# Patient Record
Sex: Female | Born: 1980 | Race: White | Hispanic: No | Marital: Married | Smoking: Never smoker
Health system: Southern US, Community
[De-identification: ages and names within clinical notes are randomized; demographics above are authoritative.]

## PROBLEM LIST (undated history)

## (undated) DIAGNOSIS — F329 Major depressive disorder, single episode, unspecified: Secondary | ICD-10-CM

## (undated) DIAGNOSIS — F419 Anxiety disorder, unspecified: Secondary | ICD-10-CM

## (undated) DIAGNOSIS — F513 Sleepwalking [somnambulism]: Secondary | ICD-10-CM

## (undated) DIAGNOSIS — M5126 Other intervertebral disc displacement, lumbar region: Secondary | ICD-10-CM

## (undated) DIAGNOSIS — F32A Depression, unspecified: Secondary | ICD-10-CM

## (undated) DIAGNOSIS — L409 Psoriasis, unspecified: Secondary | ICD-10-CM

## (undated) DIAGNOSIS — R87629 Unspecified abnormal cytological findings in specimens from vagina: Secondary | ICD-10-CM

## (undated) DIAGNOSIS — M5124 Other intervertebral disc displacement, thoracic region: Secondary | ICD-10-CM

## (undated) HISTORY — DX: Sleepwalking (somnambulism): F51.3

## (undated) HISTORY — DX: Anxiety disorder, unspecified: F41.9

## (undated) HISTORY — DX: Psoriasis, unspecified: L40.9

## (undated) HISTORY — PX: COLPOSCOPY: SHX161

## (undated) HISTORY — DX: Unspecified abnormal cytological findings in specimens from vagina: R87.629

## (undated) HISTORY — DX: Other intervertebral disc displacement, thoracic region: M51.24

## (undated) HISTORY — DX: Major depressive disorder, single episode, unspecified: F32.9

## (undated) HISTORY — DX: Depression, unspecified: F32.A

## (undated) HISTORY — PX: COCCYGECTOMY: SUR274

## (undated) HISTORY — DX: Other intervertebral disc displacement, lumbar region: M51.26

---

## 2007-05-03 ENCOUNTER — Inpatient Hospital Stay (HOSPITAL_COMMUNITY): Admission: RE | Admit: 2007-05-03 | Discharge: 2007-05-05 | Payer: Self-pay | Admitting: Gynecology

## 2007-05-03 ENCOUNTER — Encounter (INDEPENDENT_AMBULATORY_CARE_PROVIDER_SITE_OTHER): Payer: Self-pay | Admitting: Obstetrics & Gynecology

## 2007-05-15 ENCOUNTER — Ambulatory Visit: Payer: Self-pay

## 2010-02-13 ENCOUNTER — Ambulatory Visit: Payer: Self-pay | Admitting: Gynecologic Oncology

## 2010-03-05 ENCOUNTER — Ambulatory Visit: Payer: Self-pay | Admitting: Gynecologic Oncology

## 2010-06-15 ENCOUNTER — Ambulatory Visit: Payer: Self-pay | Admitting: Gynecologic Oncology

## 2010-06-25 ENCOUNTER — Ambulatory Visit: Payer: Self-pay | Admitting: Gynecologic Oncology

## 2010-06-27 LAB — PATHOLOGY REPORT

## 2010-07-16 ENCOUNTER — Ambulatory Visit: Payer: Self-pay | Admitting: Gynecologic Oncology

## 2010-08-15 ENCOUNTER — Ambulatory Visit: Payer: Self-pay | Admitting: Gynecologic Oncology

## 2010-09-17 ENCOUNTER — Ambulatory Visit: Payer: Self-pay | Admitting: Gynecologic Oncology

## 2010-10-01 ENCOUNTER — Ambulatory Visit: Payer: Self-pay | Admitting: Gynecologic Oncology

## 2010-10-16 ENCOUNTER — Ambulatory Visit: Payer: Self-pay | Admitting: Gynecologic Oncology

## 2010-12-10 ENCOUNTER — Ambulatory Visit: Payer: Self-pay | Admitting: Gynecologic Oncology

## 2010-12-15 ENCOUNTER — Ambulatory Visit: Payer: Self-pay | Admitting: Gynecologic Oncology

## 2011-05-17 LAB — HM PAP SMEAR: HM Pap smear: NORMAL

## 2011-06-10 ENCOUNTER — Ambulatory Visit: Payer: Self-pay | Admitting: Gynecologic Oncology

## 2011-06-16 ENCOUNTER — Ambulatory Visit: Payer: Self-pay | Admitting: Gynecologic Oncology

## 2011-06-27 LAB — CBC
HCT: 36.8
HCT: 38.7
Hemoglobin: 13.5
MCHC: 34.8
MCV: 92.6
Platelets: 190
Platelets: 201
RBC: 4.18
RDW: 12.6
RDW: 12.9
WBC: 11.1 — ABNORMAL HIGH
WBC: 21.6 — ABNORMAL HIGH

## 2011-06-27 LAB — RPR: RPR Ser Ql: NONREACTIVE

## 2011-07-31 ENCOUNTER — Emergency Department: Payer: Self-pay | Admitting: Emergency Medicine

## 2011-12-08 ENCOUNTER — Inpatient Hospital Stay: Payer: Self-pay | Admitting: Internal Medicine

## 2011-12-08 LAB — URINALYSIS, COMPLETE
Blood: NEGATIVE
Glucose,UR: NEGATIVE mg/dL (ref 0–75)
Nitrite: NEGATIVE
RBC,UR: <1 /HPF (ref 0–5)
Specific Gravity: 1.003 (ref 1.003–1.030)
Squamous Epithelial: 3
WBC UR: 4 /HPF (ref 0–5)

## 2011-12-08 LAB — COMPREHENSIVE METABOLIC PANEL
Albumin: 4.6 g/dL (ref 3.4–5.0)
Bilirubin,Total: 0.6 mg/dL (ref 0.2–1.0)
Co2: 26 mmol/L (ref 21–32)
EGFR (Non-African Amer.): >60
Glucose: 103 mg/dL — ABNORMAL HIGH (ref 65–99)
Potassium: 3.5 mmol/L (ref 3.5–5.1)
SGOT(AST): 20 U/L (ref 15–37)
Total Protein: 8.4 g/dL — ABNORMAL HIGH (ref 6.4–8.2)

## 2011-12-08 LAB — CBC
MCH: 30.7 pg (ref 26.0–34.0)
MCHC: 33.5 g/dL (ref 32.0–36.0)
MCV: 92 fL (ref 80–100)
RBC: 5.03 10*6/uL (ref 3.80–5.20)
RDW: 12.6 % (ref 11.5–14.5)
WBC: 5.2 10*3/uL (ref 3.6–11.0)

## 2011-12-08 LAB — DRUG SCREEN, URINE
Amphetamines, Ur Screen: POSITIVE (ref ?–1000)
Barbiturates, Ur Screen: NEGATIVE (ref ?–200)
Cannabinoid 50 Ng, Ur ~~LOC~~: NEGATIVE (ref ?–50)
MDMA (Ecstasy)Ur Screen: NEGATIVE (ref ?–500)
Tricyclic, Ur Screen: NEGATIVE (ref ?–1000)

## 2011-12-08 LAB — TSH: Thyroid Stimulating Horm: 2.69 u[IU]/mL

## 2011-12-08 LAB — ACETAMINOPHEN LEVEL: Acetaminophen: <2 ug/mL

## 2011-12-08 LAB — SALICYLATE LEVEL: Salicylates, Serum: <1.7 mg/dL

## 2011-12-08 LAB — ETHANOL: Ethanol: <3 mg/dL

## 2011-12-08 LAB — HEMOGLOBIN A1C: Hemoglobin A1C: 5.2 % (ref 4.2–6.3)

## 2011-12-08 LAB — AMMONIA: Ammonia, Plasma: <25 mcmol/L (ref 11–32)

## 2011-12-09 LAB — CBC WITH DIFFERENTIAL/PLATELET
Basophil %: 0.3 %
Eosinophil %: 1.7 %
HCT: 37.9 % (ref 35.0–47.0)
HGB: 12.7 g/dL (ref 12.0–16.0)
Lymphocyte #: 1.2 10*3/uL (ref 1.0–3.6)
MCH: 31 pg (ref 26.0–34.0)
MCHC: 33.6 g/dL (ref 32.0–36.0)
MCV: 92 fL (ref 80–100)
Monocyte %: 7.9 %
Neutrophil #: 4.4 10*3/uL (ref 1.4–6.5)
Platelet: 186 10*3/uL (ref 150–440)
RDW: 12.4 % (ref 11.5–14.5)
WBC: 6.3 10*3/uL (ref 3.6–11.0)

## 2011-12-09 LAB — COMPREHENSIVE METABOLIC PANEL
Albumin: 3.5 g/dL (ref 3.4–5.0)
Bilirubin,Total: 0.5 mg/dL (ref 0.2–1.0)
Calcium, Total: 8 mg/dL — ABNORMAL LOW (ref 8.5–10.1)
Co2: 27 mmol/L (ref 21–32)
Creatinine: 0.73 mg/dL (ref 0.60–1.30)
EGFR (African American): >60
EGFR (Non-African Amer.): >60
Glucose: 85 mg/dL (ref 65–99)
Potassium: 3.5 mmol/L (ref 3.5–5.1)
SGOT(AST): 12 U/L — ABNORMAL LOW (ref 15–37)
Sodium: 145 mmol/L (ref 136–145)
Total Protein: 6.6 g/dL (ref 6.4–8.2)

## 2011-12-10 LAB — URINE CULTURE

## 2012-08-09 ENCOUNTER — Other Ambulatory Visit: Payer: BC Managed Care – PPO

## 2012-08-09 ENCOUNTER — Ambulatory Visit (INDEPENDENT_AMBULATORY_CARE_PROVIDER_SITE_OTHER): Payer: BC Managed Care – PPO | Admitting: Internal Medicine

## 2012-08-09 ENCOUNTER — Other Ambulatory Visit (INDEPENDENT_AMBULATORY_CARE_PROVIDER_SITE_OTHER): Payer: BC Managed Care – PPO

## 2012-08-09 ENCOUNTER — Encounter: Payer: Self-pay | Admitting: Internal Medicine

## 2012-08-09 VITALS — BP 110/70 | HR 63 | Temp 97.7°F | Ht 67.0 in | Wt 149.1 lb

## 2012-08-09 DIAGNOSIS — R6889 Other general symptoms and signs: Secondary | ICD-10-CM

## 2012-08-09 DIAGNOSIS — F513 Sleepwalking [somnambulism]: Secondary | ICD-10-CM | POA: Insufficient documentation

## 2012-08-09 DIAGNOSIS — IMO0002 Reserved for concepts with insufficient information to code with codable children: Secondary | ICD-10-CM

## 2012-08-09 DIAGNOSIS — F329 Major depressive disorder, single episode, unspecified: Secondary | ICD-10-CM

## 2012-08-09 DIAGNOSIS — Z23 Encounter for immunization: Secondary | ICD-10-CM

## 2012-08-09 DIAGNOSIS — F319 Bipolar disorder, unspecified: Secondary | ICD-10-CM | POA: Insufficient documentation

## 2012-08-09 DIAGNOSIS — Z0000 Encounter for general adult medical examination without abnormal findings: Secondary | ICD-10-CM

## 2012-08-09 DIAGNOSIS — E162 Hypoglycemia, unspecified: Secondary | ICD-10-CM

## 2012-08-09 DIAGNOSIS — G478 Other sleep disorders: Secondary | ICD-10-CM

## 2012-08-09 LAB — URINALYSIS, ROUTINE W REFLEX MICROSCOPIC
Leukocytes, UA: NEGATIVE
Nitrite: NEGATIVE
Specific Gravity, Urine: <=1.005 (ref 1.000–1.030)
Urobilinogen, UA: 0.2 (ref 0.0–1.0)
pH: 7 (ref 5.0–8.0)

## 2012-08-09 LAB — HEPATIC FUNCTION PANEL
ALT: 16 U/L (ref 0–35)
AST: 19 U/L (ref 0–37)
Alkaline Phosphatase: 44 U/L (ref 39–117)
Total Bilirubin: 0.4 mg/dL (ref 0.3–1.2)

## 2012-08-09 LAB — BASIC METABOLIC PANEL
BUN: 13 mg/dL (ref 6–23)
Calcium: 9.3 mg/dL (ref 8.4–10.5)
Creatinine, Ser: 0.6 mg/dL (ref 0.4–1.2)
GFR: 120.17 mL/min (ref >60.00)
Glucose, Bld: 94 mg/dL (ref 70–99)
Sodium: 138 mEq/L (ref 135–145)

## 2012-08-09 LAB — CBC WITH DIFFERENTIAL/PLATELET
Basophils Absolute: 0 10*3/uL (ref 0.0–0.1)
Eosinophils Absolute: 0.1 10*3/uL (ref 0.0–0.7)
Hemoglobin: 13.6 g/dL (ref 12.0–15.0)
Lymphocytes Relative: 34.7 % (ref 12.0–46.0)
Monocytes Relative: 7.4 % (ref 3.0–12.0)
Neutrophils Relative %: 55.2 % (ref 43.0–77.0)
Platelets: 213 10*3/uL (ref 150.0–400.0)
RDW: 12.3 % (ref 11.5–14.6)

## 2012-08-09 LAB — HEMOGLOBIN A1C: Hgb A1c MFr Bld: 5 % (ref 4.6–6.5)

## 2012-08-09 LAB — LIPID PANEL
HDL: 51.9 mg/dL (ref >39.00)
VLDL: 6.6 mg/dL (ref 0.0–40.0)

## 2012-08-09 LAB — TSH: TSH: 1.28 u[IU]/mL (ref 0.35–5.50)

## 2012-08-09 NOTE — Patient Instructions (Addendum)
It was good to see you today. We have reviewed your prior records including labs and tests today Health Maintenance reviewed - all recommended immunizations and age-appropriate screenings are up-to-date. Test(s) ordered today. Your results will be released to MyChart (or called to you) after review, usually within 72hours after test completion. If any changes need to be made, you will be notified at that same time. we'll make referral to gynecology for your history of abnormal PAP smear. Our office will contact you regarding appointment(s) once made. Medications reviewed, no changes recommended at this time.  we will send to your prior provider(s) for "release of records" as discussed today - dr Dorina Hoyer and [facility] Please schedule followup in 6 months for additional review, call sooner if problems. 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  Maintain regular health, dental, and eye exams.   Eat a healthy diet. Foods like vegetables, fruits, whole grains, low-fat dairy products, and lean protein foods contain the nutrients you need without too many calories. Decrease your intake of foods high in solid fats, added sugars, and salt. Get information about a proper diet from your caregiver, if necessary.   Regular physical exercise is one of the most important things you can do for your health. Most adults should get at least 150 minutes of moderate-intensity exercise (any activity that increases your heart rate and causes you to sweat) each week. In addition, most adults need muscle-strengthening exercises on 2 or more days a week.     Maintain a healthy weight. The body mass index (BMI) is a screening tool to identify possible weight problems. It provides an estimate of body fat based on height and weight. Your  caregiver can help determine your BMI, and can help you achieve or maintain a healthy weight. For adults 20 years and older:   A BMI below 18.5 is considered underweight.   A BMI of 18.5 to 24.9 is normal.   A BMI of 25 to 29.9 is considered overweight.   A BMI of 30 and above is considered obese.   Maintain normal blood lipids and cholesterol by exercising and minimizing your intake of saturated fat. Eat a balanced diet with plenty of fruits and vegetables. Blood tests for lipids and cholesterol should begin at age 24 and be repeated every 5 years. If your lipid or cholesterol levels are high, you are over 50, or you are a high risk for heart disease, you may need your cholesterol levels checked more frequently. Ongoing high lipid and cholesterol levels should be treated with medicines if diet and exercise are not effective.   If you smoke, find out from your caregiver how to quit. If you do not use tobacco, do not start.   If you are pregnant, do not drink alcohol. If you are breastfeeding, be very cautious about drinking alcohol. If you are not pregnant and choose to drink alcohol, do not exceed 1 drink per day. One drink is considered to be 12 ounces (355 mL) of beer, 5 ounces (148 mL) of wine, or 1.5 ounces (44 mL) of liquor.   Avoid use of street drugs. Do not share needles with anyone. Ask for help if you need support or instructions about stopping the use of drugs.   High blood pressure causes heart disease and increases the risk of stroke. Blood pressure  should be checked at least every 1 to 2 years. Ongoing high blood pressure should be treated with medicines, if weight loss and exercise are not effective.   If you are 37 to 32 years old, ask your caregiver if you should take aspirin to prevent strokes.   Diabetes screening involves taking a blood sample to check your fasting blood sugar level. This should be done once every 3 years, after age 83, if you are within normal weight and  without risk factors for diabetes. Testing should be considered at a younger age or be carried out more frequently if you are overweight and have at least 1 risk factor for diabetes.   Breast cancer screening is essential preventative care for women. You should practice "breast self-awareness." This means understanding the normal appearance and feel of your breasts and may include breast self-examination. Any changes detected, no matter how small, should be reported to a caregiver. Women in their 14s and 30s should have a clinical breast exam (CBE) by a caregiver as part of a regular health exam every 1 to 3 years. After age 19, women should have a CBE every year. Starting at age 75, women should consider having a mammogram (breast X-ray) every year. Women who have a family history of breast cancer should talk to their caregiver about genetic screening. Women at a high risk of breast cancer should talk to their caregiver about having an MRI and a mammogram every year.   The Pap test is a screening test for cervical cancer. Women should have a Pap test starting at age 69. Between ages 34 and 15, Pap tests should be repeated every 2 years. Beginning at age 71, you should have a Pap test every 3 years as long as the past 3 Pap tests have been normal. If you had a hysterectomy for a problem that was not cancer or a condition that could lead to cancer, then you no longer need Pap tests. If you are between ages 34 and 51, and you have had normal Pap tests going back 10 years, you no longer need Pap tests. If you have had past treatment for cervical cancer or a condition that could lead to cancer, you need Pap tests and screening for cancer for at least 20 years after your treatment. If Pap tests have been discontinued, risk factors (such as a new sexual partner) need to be reassessed to determine if screening should be resumed. Some women have medical problems that increase the chance of getting cervical cancer. In  these cases, your caregiver may recommend more frequent screening and Pap tests.   The human papillomavirus (HPV) test is an additional test that may be used for cervical cancer screening. The HPV test looks for the virus that can cause the cell changes on the cervix. The cells collected during the Pap test can be tested for HPV. The HPV test could be used to screen women aged 36 years and older, and should be used in women of any age who have unclear Pap test results. After the age of 31, women should have HPV testing at the same frequency as a Pap test.   Colorectal cancer can be detected and often prevented. Most routine colorectal cancer screening begins at the age of 41 and continues through age 23. However, your caregiver may recommend screening at an earlier age if you have risk factors for colon cancer. On a yearly basis, your caregiver may provide home test kits to check for hidden  blood in the stool. Use of a small camera at the end of a tube, to directly examine the colon (sigmoidoscopy or colonoscopy), can detect the earliest forms of colorectal cancer. Talk to your caregiver about this at age 5, when routine screening begins. Direct examination of the colon should be repeated every 5 to 10 years through age 27, unless early forms of pre-cancerous polyps or small growths are found.   Hepatitis C blood testing is recommended for all people born from [birth year] through 1966 and any individual with known risks for hepatitis C.   Practice safe sex. Use condoms and avoid high-risk sexual practices to reduce the spread of sexually transmitted infections (STIs). Sexually active women aged 23 and younger should be checked for Chlamydia, which is a common sexually transmitted infection. Older women with new or multiple partners should also be tested for Chlamydia. Testing for other STIs is recommended if you are sexually active and at increased risk.   Osteoporosis is a disease in which the bones lose  minerals and strength with aging. This can result in serious bone fractures. The risk of osteoporosis can be identified using a bone density scan. Women ages 39 and over and women at risk for fractures or osteoporosis should discuss screening with their caregivers. Ask your caregiver whether you should be taking a calcium supplement or vitamin D to reduce the rate of osteoporosis.   Menopause can be associated with physical symptoms and risks. Hormone replacement therapy is available to decrease symptoms and risks. You should talk to your caregiver about whether hormone replacement therapy is right for you.   Use sunscreen with a sun protection factor (SPF) of 30 or greater. Apply sunscreen liberally and repeatedly throughout the day. You should seek shade when your shadow is shorter than you. Protect yourself by wearing long sleeves, pants, a wide-brimmed hat, and sunglasses year round, whenever you are outdoors.   Notify your caregiver of new moles or changes in moles, especially if there is a change in shape or color. Also notify your caregiver if a mole is larger than the size of a pencil eraser.   Stay current with your immunizations.  Document Released: 03/17/2011 Document Revised: 11/24/2011 Document Reviewed: 03/17/2011 [facility] Patient Information 2013 [place], [place].

## 2012-08-09 NOTE — Progress Notes (Signed)
  Subjective:    Patient ID: Valerie Livingston, female    DOB: [birth year], 31 y.o.   MRN: [id number]  HPI New patient to me and our group, here to establish care  patient is here today for annual physical. Patient feels well overall -  Concerned about episodes of hypoglycemia symptoms associated with nausea, sweating -works to combat same by eating frequent meals every hour or 2, including sleepwalking at night  Sleepwalking since early adolescence. Reports waking 8-12 times per night for purposes of eating. Denies injury. Has undergone psychological, psychiatric and sleep evaluation without definitive cause identified. No relief with prescription sleep medications or treatment of bipolar/seizure therapies -working with both counseling and psychiatric care at this time for same  Past Medical History  Diagnosis Date  . Depression   . Sleep walking and eating    Family History  Problem Relation Age of Onset  . Diabetes Mother    History  Substance Use Topics  . Smoking status: Never Smoker   . Smokeless tobacco: Not on file     Comment: married, Tree surgeon  . Alcohol Use: No    Review of Systems Constitutional: Negative for fever or weight change.  Respiratory: Negative for cough and shortness of breath.   Cardiovascular: Negative for chest pain or palpitations.  Gastrointestinal: Negative for abdominal pain, no bowel changes.  Musculoskeletal: Negative for gait problem or joint swelling.  Skin: Negative for rash.  Neurological: Negative for dizziness or headache.  No other specific complaints in a complete review of systems (except as listed in HPI above).     Objective:   Physical Exam BP 110/70  Pulse 63  Temp 97.7 F (36.5 C) (Oral)  Ht 5\' 7"  (1.702 m)  Wt 149 lb 1.9 oz (67.64 kg)  BMI 23.36 kg/m2  SpO2 98% Wt Readings from Last 3 Encounters:  08/09/12 149 lb 1.9 oz (67.64 kg)   Constitutional: She appears well-developed and well-nourished. No distress.  HENT: Head:  Normocephalic and atraumatic. Ears: B TMs ok, no erythema or effusion; Nose: Nose normal. Mouth/Throat: Oropharynx is clear and moist. No oropharyngeal exudate.  Eyes: Conjunctivae and EOM are normal. Pupils are equal, round, and reactive to light. No scleral icterus.  Neck: Normal range of motion. Neck supple. No JVD present. No thyromegaly present.  Cardiovascular: Normal rate, regular rhythm and normal heart sounds.  No murmur heard. No BLE edema. Pulmonary/Chest: Effort normal and breath sounds normal. No respiratory distress. She has no wheezes.  Abdominal: Soft. Bowel sounds are normal. She exhibits no distension. There is no tenderness. no masses Musculoskeletal: Normal range of motion, no joint effusions. No gross deformities Neurological: She is alert and oriented to person, place, and time. No cranial nerve deficit. Coordination normal.  Skin: Skin is warm and dry. No rash noted. No erythema.  Psychiatric: She has a normal mood and affect. Her behavior is normal. Judgment and thought content normal.   Lab Results  Component Value Date   WBC 21.6* 05/04/2007   HGB 13.5 05/04/2007   HCT 38.7 05/04/2007   PLT 201 05/04/2007        Assessment & Plan:   CPX/v70.0 - Patient has been counseled on age-appropriate routine health concerns for screening and prevention. These are reviewed and up-to-date. Immunizations are up-to-date or declined. Labs ordered and reviewed.  Hypoglycemia symptoms - reports need for frequent meals every 1-2 hours to avoid symptomatic hypoglycemia with sweating, shakes and nausea -family history diabetes noted. Check A1c now

## 2012-08-09 NOTE — Assessment & Plan Note (Signed)
Long history of same, prior sleep evaluation as well psychiatric evaluation without definitive therapy per patient report No relief with the right he of bipolar medications, anti-seizure medications or sleeping or prescription meds Will send for records regarding prior sleep evaluation and psychiatric care at patient request No change in treatment therapies recommended at this time, to continue working with psychiatric care and counseling is ongoing

## 2012-08-09 NOTE — Assessment & Plan Note (Signed)
>>  ASSESSMENT AND PLAN FOR DEPRESSION WRITTEN ON 08/09/2012  9:29 AM BY Newt Lukes, MD  Long history of same reviewed, prior suicidal intent with overdose March 2012 Currently working on weaning off of medications as per her psychiatric care Will send for records from [facility] for evaluation of last overdose episode Currently denies SI/HI. Reports mood stable and agrees to follow with counseling as ongoing

## 2012-08-09 NOTE — Addendum Note (Signed)
Addended by: Deatra James on: 08/09/2012 09:49 AM   Modules accepted: Orders

## 2012-08-09 NOTE — Assessment & Plan Note (Signed)
Long history of same reviewed, prior suicidal intent with overdose March 2012 Currently working on weaning off of medications as per her psychiatric care Will send for records from [facility] for evaluation of last overdose episode Currently denies SI/HI. Reports mood stable and agrees to follow with counseling as ongoing

## 2012-12-03 ENCOUNTER — Encounter: Payer: Self-pay | Admitting: Internal Medicine

## 2012-12-03 ENCOUNTER — Ambulatory Visit (INDEPENDENT_AMBULATORY_CARE_PROVIDER_SITE_OTHER): Payer: BC Managed Care – PPO | Admitting: Internal Medicine

## 2012-12-03 VITALS — BP 100/68 | HR 97 | Temp 98.5°F

## 2012-12-03 DIAGNOSIS — F329 Major depressive disorder, single episode, unspecified: Secondary | ICD-10-CM

## 2012-12-03 MED ORDER — ESCITALOPRAM OXALATE 20 MG PO TABS: 20.0000 mg | ORAL_TABLET | Freq: Every day | ORAL | Status: DC

## 2012-12-03 NOTE — Assessment & Plan Note (Signed)
>>  ASSESSMENT AND PLAN FOR DEPRESSION WRITTEN ON 12/03/2012  2:58 PM BY Rene Paci A, MD  Long history of same reviewed, prior suicidal intent with overdose March 2012 weaned off medications fall 2013 as per her psychiatric care Increase symptoms with anniversary of friends suicide every March - hypersomnia and overwhelming Resume lexapro 20mg  now - erx done follow up 6 weeks if unimproved, sooner if worse Currently denies SI/HI. agrees to follow with counseling as ongoing

## 2012-12-03 NOTE — Progress Notes (Signed)
  Subjective:    Patient ID: Valerie Livingston, female    DOB: 17-Jul-1981, 32 y.o.   MRN: [id number]  HPI complains of fatigue Concerned for recurrent depression - Denies SI/HI  Also reviewed chronic medical issues:  Sleepwalking since early adolescence. Reports waking 8-12 times per night for purposes of eating. Denies injury. Has undergone psychological, psychiatric and sleep evaluation without definitive cause identified. No relief with prescription sleep medications or treatment of bipolar/seizure therapies -working with both counseling and psychiatric care at this time for same  Past Medical History  Diagnosis Date  . Depression   . Sleep walking and eating     Review of Systems  Constitutional: Negative for fever or weight change.  Respiratory: Negative for cough and shortness of breath.   Cardiovascular: Negative for chest pain or palpitations.      Objective:   Physical Exam  BP 100/68  Pulse 97  Temp(Src) 98.5 F (36.9 C) (Oral)  SpO2 96% Wt Readings from Last 3 Encounters:  08/09/12 149 lb 1.9 oz (67.64 kg)   Constitutional: She appears well-developed and well-nourished. No distress. Neck: Normal range of motion. Neck supple. No JVD present. No thyromegaly present.  Cardiovascular: Normal rate, regular rhythm and normal heart sounds.  No murmur heard. No BLE edema. Pulmonary/Chest: Effort normal and breath sounds normal. No respiratory distress. She has no wheezes. Psychiatric: She has a dysphoric mood and affect. Her behavior is normal. Judgment and thought content normal.   Lab Results  Component Value Date   WBC 5.2 08/09/2012   HGB 13.6 08/09/2012   HCT 39.6 08/09/2012   PLT 213.0 08/09/2012   GLUCOSE 94 08/09/2012   CHOL 178 08/09/2012   TRIG 33.0 08/09/2012   HDL 51.90 08/09/2012   LDLCALC 120* 08/09/2012   ALT 16 08/09/2012   AST 19 08/09/2012   NA 138 08/09/2012   K 4.3 08/09/2012   CL 103 08/09/2012   CREATININE 0.6 08/09/2012   BUN 13  08/09/2012   CO2 31 08/09/2012   TSH 1.28 08/09/2012   HGBA1C 5.0 08/09/2012       Assessment & Plan:   See problem list. Medications and labs reviewed today.

## 2012-12-03 NOTE — Patient Instructions (Signed)
It was good to see you today. We have reviewed your prior records including labs and tests today Lexapro 20 mg daily - Your prescription(s) have been submitted to your pharmacy. Please take as directed and contact our office if you believe you are having problem(s) with the medication(s). Please schedule followup in 6 months for recheck, call sooner if problems. Depression, Adult Depression refers to feeling sad, low, down in the dumps, blue, gloomy, or empty. In general, there are two kinds of depression: 1. Depression that we all experience from time to time because of upsetting life experiences, including the loss of a job or the ending of a relationship (normal sadness or normal grief). This kind of depression is considered normal, is short lived, and resolves within a few days to 2 weeks. (Depression experienced after the loss of a loved one is called bereavement. Bereavement often lasts longer than 2 weeks but normally gets better with time.) 2. Clinical depression, which lasts longer than normal sadness or normal grief or interferes with your ability to function at home, at work, and in school. It also interferes with your personal relationships. It affects almost every aspect of your life. Clinical depression is an illness. Symptoms of depression also can be caused by conditions other than normal sadness and grief or clinical depression. Examples of these conditions are listed as follows:  Physical illness Some physical illnesses, including underactive thyroid gland (hypothyroidism), severe anemia, specific types of cancer, diabetes, uncontrolled seizures, heart and lung problems, strokes, and chronic pain are commonly associated with symptoms of depression.  Side effects of some prescription medicine In some people, certain types of prescription medicine can cause symptoms of depression.  Substance abuse Abuse of alcohol and illicit drugs can cause symptoms of depression. SYMPTOMS Symptoms  of normal sadness and normal grief include the following:  Feeling sad or crying for short periods of time.  Not caring about anything (apathy).  Difficulty sleeping or sleeping too much.  No longer able to enjoy the things you used to enjoy.  Desire to be by oneself all the time (social isolation).  Lack of energy or motivation.  Difficulty concentrating or remembering.  Change in appetite or weight.  Restlessness or agitation. Symptoms of clinical depression include the same symptoms of normal sadness or normal grief and also the following symptoms:  Feeling sad or crying all the time.  Feelings of guilt or worthlessness.  Feelings of hopelessness or helplessness.  Thoughts of suicide or the desire to harm yourself (suicidal ideation).  Loss of touch with reality (psychotic symptoms). Seeing or hearing things that are not real (hallucinations) or having false beliefs about your life or the people around you (delusions and paranoia). DIAGNOSIS  The diagnosis of clinical depression usually is based on the severity and duration of the symptoms. Your caregiver also will ask you questions about your medical history and substance use to find out if physical illness, use of prescription medicine, or substance abuse is causing your depression. Your caregiver also may order blood tests. TREATMENT  Typically, normal sadness and normal grief do not require treatment. However, sometimes antidepressant medicine is prescribed for bereavement to ease the depressive symptoms until they resolve. The treatment for clinical depression depends on the severity of your symptoms but typically includes antidepressant medicine, counseling with a mental health professional, or a combination of both. Your caregiver will help to determine what treatment is best for you. Depression caused by physical illness usually goes away with appropriate medical treatment  of the illness. If prescription medicine is  causing depression, talk with your caregiver about stopping the medicine, decreasing the dose, or substituting another medicine. Depression caused by abuse of alcohol or illicit drugs abuse goes away with abstinence from these substances. Some adults need professional help in order to stop drinking or using drugs. SEEK IMMEDIATE CARE IF:  You have thoughts about hurting yourself or others.  You lose touch with reality (have psychotic symptoms).  You are taking medicine for depression and have a serious side effect. FOR MORE INFORMATION National [employer] on Mental Illness: www.nami.[employer]: http://www.maynard.net/ Document Released: 08/29/2000 Document Revised: 03/02/2012 Document Reviewed: 12/01/2011 [facility] Patient Information 2013 [place], [place].

## 2012-12-03 NOTE — Assessment & Plan Note (Signed)
Long history of same reviewed, prior suicidal intent with overdose March 2012 weaned off medications fall 2013 as per her psychiatric care Increase symptoms with anniversary of friends suicide every March - hypersomnia and overwhelming Resume lexapro 20mg  now - erx done follow up 6 weeks if unimproved, sooner if worse Currently denies SI/HI. agrees to follow with counseling as ongoing

## 2012-12-23 ENCOUNTER — Telehealth: Payer: Self-pay

## 2012-12-23 DIAGNOSIS — F329 Major depressive disorder, single episode, unspecified: Secondary | ICD-10-CM

## 2012-12-23 MED ORDER — DULOXETINE HCL 30 MG PO CPEP: 30.0000 mg | ORAL_CAPSULE | Freq: Every day | ORAL | Status: DC

## 2012-12-23 NOTE — Telephone Encounter (Signed)
Stop lexapro Start cymbalta - erx done Refer to psyc placed - [facility] will call thanks

## 2012-12-23 NOTE — Telephone Encounter (Signed)
Pt called stating Lexapro has not helped with depression sxs. Pt is requesting advisement from MD as well as a referral to psychiatry.

## 2012-12-27 ENCOUNTER — Ambulatory Visit (INDEPENDENT_AMBULATORY_CARE_PROVIDER_SITE_OTHER): Payer: BC Managed Care – PPO | Admitting: Internal Medicine

## 2012-12-27 ENCOUNTER — Encounter: Payer: Self-pay | Admitting: Internal Medicine

## 2012-12-27 VITALS — BP 102/76 | HR 76 | Temp 98.0°F | Wt 143.4 lb

## 2012-12-27 DIAGNOSIS — F329 Major depressive disorder, single episode, unspecified: Secondary | ICD-10-CM

## 2012-12-27 DIAGNOSIS — F32A Depression, unspecified: Secondary | ICD-10-CM

## 2012-12-27 DIAGNOSIS — F314 Bipolar disorder, current episode depressed, severe, without psychotic features: Secondary | ICD-10-CM

## 2012-12-27 DIAGNOSIS — F3289 Other specified depressive episodes: Secondary | ICD-10-CM

## 2012-12-27 MED ORDER — LORAZEPAM 0.5 MG PO TABS: 0.5000 mg | ORAL_TABLET | Freq: Two times a day (BID) | ORAL | Status: DC | PRN

## 2012-12-27 MED ORDER — LITHIUM CARBONATE 300 MG PO TABS: 300.0000 mg | ORAL_TABLET | Freq: Three times a day (TID) | ORAL | Status: DC

## 2012-12-27 NOTE — Progress Notes (Signed)
  Subjective:    Patient ID: Valerie Livingston, female    DOB: 13-Sep-1981, 32 y.o.   MRN: [id number]  HPI complains of continued, recurrent depression - ?bipolar as also noted periods of manic behavior in past Currently in bed, unmotivated Denies SI/HI SSRI and SNRI therapies ineffective, ?Lithium Pending psyc eval in 4 weeks for same  Also reviewed chronic medical issues:  Sleepwalking since early adolescence. Reports waking 8-12 times per night for purposes of eating. Denies injury. Has undergone psychological, psychiatric and sleep evaluation without definitive cause identified. No relief with prescription sleep medications or treatment of bipolar/seizure therapies -working with both counseling and psychiatric care at this time for same  Past Medical History  Diagnosis Date  . Depression   . Sleep walking and eating     Review of Systems  Constitutional: Negative for fever or weight change.  Respiratory: Negative for cough and shortness of breath.   Cardiovascular: Negative for chest pain or palpitations.      Objective:   Physical Exam  BP 102/76  Pulse 76  Temp(Src) 98 F (36.7 C) (Oral)  Wt 143 lb 6.4 oz (65.046 kg)  BMI 22.45 kg/m2  SpO2 97% Wt Readings from Last 3 Encounters:  12/27/12 143 lb 6.4 oz (65.046 kg)  08/09/12 149 lb 1.9 oz (67.64 kg)   Constitutional: She appears well-developed and well-nourished. No distress. spouse at side Neck: Normal range of motion. Neck supple. No JVD present. No thyromegaly present.  Cardiovascular: Normal rate, regular rhythm and normal heart sounds.  No murmur heard. No BLE edema. Pulmonary/Chest: Effort normal and breath sounds normal. No respiratory distress. She has no wheezes. Psychiatric: She has a dysphoric mood and affect. Her behavior is normal. Judgment and thought content normal.   Lab Results  Component Value Date   WBC 5.2 08/09/2012   HGB 13.6 08/09/2012   HCT 39.6 08/09/2012   PLT 213.0 08/09/2012   GLUCOSE 94 08/09/2012   CHOL 178 08/09/2012   TRIG 33.0 08/09/2012   HDL 51.90 08/09/2012   LDLCALC 120* 08/09/2012   ALT 16 08/09/2012   AST 19 08/09/2012   NA 138 08/09/2012   K 4.3 08/09/2012   CL 103 08/09/2012   CREATININE 0.6 08/09/2012   BUN 13 08/09/2012   CO2 31 08/09/2012   TSH 1.28 08/09/2012   HGBA1C 5.0 08/09/2012       Assessment & Plan:   See problem list. Medications and labs reviewed today.

## 2012-12-27 NOTE — Assessment & Plan Note (Signed)
Long history of same reviewed, prior suicidal intent with overdose March 2012 weaned off medications fall 2013 as per her psychiatric care Increase symptoms with anniversary of friends suicide every March - hypersomnia and overwhelming Resumed lexapro 20mg , but still very low symptoms consistent with bipolar, currently depressed Pending eval with psyc Evelene Croon 01/28/13 Start Lithium 300 TID (prev Lamictal ineffective) - continue SSRI as well Also refill lorazepam 0.5 - has used same in past for anxiety/pnic symptoms  Currently denies SI/HI. agrees to follow with counseling as ongoing

## 2012-12-27 NOTE — Patient Instructions (Addendum)
It was good to see you today. We have reviewed your prior records including labs and tests today Start Lithium 300mg  3x/day and use lorazepam as needed for anxiety symptoms  -  Your prescription(s) have been submitted to your pharmacy. Please take as directed and contact our office if you believe you are having problem(s) with the medication(s). Please schedule followup in 2-4 weeks for recheck, call sooner if problems. follow up with psychiatry Dr Evelene Croon as planned 01/28/13 Depression, Adult Depression refers to feeling sad, low, down in the dumps, blue, gloomy, or empty. In general, there are two kinds of depression: 1. Depression that we all experience from time to time because of upsetting life experiences, including the loss of a job or the ending of a relationship (normal sadness or normal grief). This kind of depression is considered normal, is short lived, and resolves within a few days to 2 weeks. (Depression experienced after the loss of a loved one is called bereavement. Bereavement often lasts longer than 2 weeks but normally gets better with time.) 2. Clinical depression, which lasts longer than normal sadness or normal grief or interferes with your ability to function at home, at work, and in school. It also interferes with your personal relationships. It affects almost every aspect of your life. Clinical depression is an illness. Symptoms of depression also can be caused by conditions other than normal sadness and grief or clinical depression. Examples of these conditions are listed as follows:  Physical illness Some physical illnesses, including underactive thyroid gland (hypothyroidism), severe anemia, specific types of cancer, diabetes, uncontrolled seizures, heart and lung problems, strokes, and chronic pain are commonly associated with symptoms of depression.  Side effects of some prescription medicine In some people, certain types of prescription medicine can cause symptoms of  depression.  Substance abuse Abuse of alcohol and illicit drugs can cause symptoms of depression. SYMPTOMS Symptoms of normal sadness and normal grief include the following:  Feeling sad or crying for short periods of time.  Not caring about anything (apathy).  Difficulty sleeping or sleeping too much.  No longer able to enjoy the things you used to enjoy.  Desire to be by oneself all the time (social isolation).  Lack of energy or motivation.  Difficulty concentrating or remembering.  Change in appetite or weight.  Restlessness or agitation. Symptoms of clinical depression include the same symptoms of normal sadness or normal grief and also the following symptoms:  Feeling sad or crying all the time.  Feelings of guilt or worthlessness.  Feelings of hopelessness or helplessness.  Thoughts of suicide or the desire to harm yourself (suicidal ideation).  Loss of touch with reality (psychotic symptoms). Seeing or hearing things that are not real (hallucinations) or having false beliefs about your life or the people around you (delusions and paranoia). DIAGNOSIS  The diagnosis of clinical depression usually is based on the severity and duration of the symptoms. Your caregiver also will ask you questions about your medical history and substance use to find out if physical illness, use of prescription medicine, or substance abuse is causing your depression. Your caregiver also may order blood tests. TREATMENT  Typically, normal sadness and normal grief do not require treatment. However, sometimes antidepressant medicine is prescribed for bereavement to ease the depressive symptoms until they resolve. The treatment for clinical depression depends on the severity of your symptoms but typically includes antidepressant medicine, counseling with a mental health professional, or a combination of both. Your caregiver will help to  determine what treatment is best for you. Depression caused  by physical illness usually goes away with appropriate medical treatment of the illness. If prescription medicine is causing depression, talk with your caregiver about stopping the medicine, decreasing the dose, or substituting another medicine. Depression caused by abuse of alcohol or illicit drugs abuse goes away with abstinence from these substances. Some adults need professional help in order to stop drinking or using drugs. SEEK IMMEDIATE CARE IF:  You have thoughts about hurting yourself or others.  You lose touch with reality (have psychotic symptoms).  You are taking medicine for depression and have a serious side effect. FOR MORE INFORMATION National Alliance on Mental Illness: www.nami.[employer]: http://www.maynard.net/ Document Released: 08/29/2000 Document Revised: 03/02/2012 Document Reviewed: 12/01/2011 [facility] Patient Information 2013 [place], [place].

## 2013-01-06 ENCOUNTER — Encounter: Payer: Self-pay | Admitting: Internal Medicine

## 2013-01-10 ENCOUNTER — Encounter: Payer: Self-pay | Admitting: Internal Medicine

## 2013-01-10 DIAGNOSIS — Z79899 Other long term (current) drug therapy: Secondary | ICD-10-CM

## 2013-01-11 MED ORDER — LITHIUM CARBONATE 300 MG PO TABS: 300.0000 mg | ORAL_TABLET | Freq: Four times a day (QID) | ORAL | Status: DC

## 2013-01-21 ENCOUNTER — Other Ambulatory Visit (INDEPENDENT_AMBULATORY_CARE_PROVIDER_SITE_OTHER): Payer: BC Managed Care – PPO

## 2013-01-21 DIAGNOSIS — Z79899 Other long term (current) drug therapy: Secondary | ICD-10-CM

## 2013-01-21 LAB — BASIC METABOLIC PANEL
BUN: 8 mg/dL (ref 6–23)
Chloride: 102 mEq/L (ref 96–112)
Potassium: 3.9 mEq/L (ref 3.5–5.1)

## 2013-01-21 LAB — TSH: TSH: 1.86 u[IU]/mL (ref 0.35–5.50)

## 2013-01-22 LAB — LITHIUM LEVEL: Lithium Lvl: 1 mEq/L (ref 0.80–1.40)

## 2013-02-02 ENCOUNTER — Ambulatory Visit: Payer: BC Managed Care – PPO | Admitting: Internal Medicine

## 2013-05-20 ENCOUNTER — Encounter: Payer: Self-pay | Admitting: Internal Medicine

## 2013-05-29 ENCOUNTER — Other Ambulatory Visit: Payer: Self-pay | Admitting: Internal Medicine

## 2013-06-08 ENCOUNTER — Ambulatory Visit (INDEPENDENT_AMBULATORY_CARE_PROVIDER_SITE_OTHER): Payer: BC Managed Care – PPO | Admitting: Internal Medicine

## 2013-06-08 ENCOUNTER — Encounter: Payer: Self-pay | Admitting: Internal Medicine

## 2013-06-08 VITALS — BP 112/68 | HR 82 | Temp 98.0°F | Wt 136.0 lb

## 2013-06-08 DIAGNOSIS — K4090 Unilateral inguinal hernia, without obstruction or gangrene, not specified as recurrent: Secondary | ICD-10-CM | POA: Insufficient documentation

## 2013-06-08 DIAGNOSIS — J069 Acute upper respiratory infection, unspecified: Secondary | ICD-10-CM

## 2013-06-08 DIAGNOSIS — L408 Other psoriasis: Secondary | ICD-10-CM

## 2013-06-08 DIAGNOSIS — L409 Psoriasis, unspecified: Secondary | ICD-10-CM | POA: Insufficient documentation

## 2013-06-08 DIAGNOSIS — F329 Major depressive disorder, single episode, unspecified: Secondary | ICD-10-CM

## 2013-06-08 MED ORDER — BETAMETHASONE DIPROPIONATE AUG 0.05 % EX CREA: TOPICAL_CREAM | Freq: Two times a day (BID) | CUTANEOUS | Status: DC

## 2013-06-08 NOTE — Assessment & Plan Note (Signed)
betametasone ineffective - Try different high pot steroid Advised follow up with derm as needed

## 2013-06-08 NOTE — Assessment & Plan Note (Signed)
>>  ASSESSMENT AND PLAN FOR DEPRESSION WRITTEN ON 06/08/2013 11:20 AM BY Rene Paci A, MD  Long history of same reviewed, prior suicidal intent with overdose March 2012 weaned off medications fall 2013 as per psychiatric care Judithann Graves) Increase symptoms with anniversary of friends suicide every March - hypersomnia and overwhelming fatigue Resumed lexapro 20mg  11/2012 and working with new psyc Evelene Croon) Trial Lithium for ?bipolar not effective - "because i;m not bipolar" Then added Wellbutrin to Lexapro - worked well! Now feels stable, stopped Wellbutrin in anticipation of pregnancy plans And weaning off lexapro -  Advised to continue working closely psyc

## 2013-06-08 NOTE — Assessment & Plan Note (Signed)
Long history of same reviewed, prior suicidal intent with overdose March 2012 weaned off medications fall 2013 as per psychiatric care Judithann Graves) Increase symptoms with anniversary of friends suicide every March - hypersomnia and overwhelming fatigue Resumed lexapro 20mg  11/2012 and working with new psyc Evelene Croon) Trial Lithium for ?bipolar not effective - "because i;m not bipolar" Then added Wellbutrin to Lexapro - worked well! Now feels stable, stopped Wellbutrin in anticipation of pregnancy plans And weaning off lexapro -  Advised to continue working closely psyc

## 2013-06-08 NOTE — Patient Instructions (Addendum)
It was good to see you today. We have reviewed your prior records including labs and tests today Medications reviewed and updated, will send new steroid cream for your psoriasis, no other changes recommended at this time We will ask pharmacy to cancel all of your prescriptions previously prescribed as requested Continue symptomatic care for upper respiratory infection symptoms as discussed, proceed with flu shot when she has been afebrile for 72 hours or longer Okay to continue observation of your hernia, see details below regarding red flags for which to seek emergency care Followup in 6 months for annual exam and labs, call sooner if problems  Upper Respiratory Infection, Adult An upper respiratory infection (URI) is also sometimes known as the common cold. The upper respiratory tract includes the nose, sinuses, throat, trachea, and bronchi. Bronchi are the airways leading to the lungs. Most people improve within 1 week, but symptoms can last up to 2 weeks. A residual cough may last even longer.  CAUSES Many different viruses can infect the tissues lining the upper respiratory tract. The tissues become irritated and inflamed and often become very moist. Mucus production is also common. A cold is contagious. You can easily spread the virus to others by oral contact. This includes kissing, sharing a glass, coughing, or sneezing. Touching your mouth or nose and then touching a surface, which is then touched by another person, can also spread the virus. SYMPTOMS  Symptoms typically develop 1 to 3 days after you come in contact with a cold virus. Symptoms vary from person to person. They may include:  Runny nose.  Sneezing.  Nasal congestion.  Sinus irritation.  Sore throat.  Loss of voice (laryngitis).  Cough.  Fatigue.  Muscle aches.  Loss of appetite.  Headache.  Low-grade fever. DIAGNOSIS  You might diagnose your own cold based on familiar symptoms, since most people get a  cold 2 to 3 times a year. Your caregiver can confirm this based on your exam. Most importantly, your caregiver can check that your symptoms are not due to another disease such as strep throat, sinusitis, pneumonia, asthma, or epiglottitis. Blood tests, throat tests, and X-rays are not necessary to diagnose a common cold, but they may sometimes be helpful in excluding other more serious diseases. Your caregiver will decide if any further tests are required. RISKS AND COMPLICATIONS  You may be at risk for a more severe case of the common cold if you smoke cigarettes, have chronic heart disease (such as heart failure) or lung disease (such as asthma), or if you have a weakened immune system. The very young and very old are also at risk for more serious infections. Bacterial sinusitis, middle ear infections, and bacterial pneumonia can complicate the common cold. The common cold can worsen asthma and chronic obstructive pulmonary disease (COPD). Sometimes, these complications can require emergency medical care and may be life-threatening. PREVENTION  The best way to protect against getting a cold is to practice good hygiene. Avoid oral or hand contact with people with cold symptoms. Wash your hands often if contact occurs. There is no clear evidence that vitamin C, vitamin E, echinacea, or exercise reduces the chance of developing a cold. However, it is always recommended to get plenty of rest and practice good nutrition. TREATMENT  Treatment is directed at relieving symptoms. There is no cure. Antibiotics are not effective, because the infection is caused by a virus, not by bacteria. Treatment may include:  Increased fluid intake. Sports drinks offer valuable electrolytes, sugars,  and fluids.  Breathing heated mist or steam (vaporizer or shower).  Eating chicken soup or other clear broths, and maintaining good nutrition.  Getting plenty of rest.  Using gargles or lozenges for comfort.  Controlling  fevers with ibuprofen or acetaminophen as directed by your caregiver.  Increasing usage of your inhaler if you have asthma. Zinc gel and zinc lozenges, taken in the first 24 hours of the common cold, can shorten the duration and lessen the severity of symptoms. Pain medicines may help with fever, muscle aches, and throat pain. A variety of non-prescription medicines are available to treat congestion and runny nose. Your caregiver can make recommendations and may suggest nasal or lung inhalers for other symptoms.  HOME CARE INSTRUCTIONS   Only take over-the-counter or prescription medicines for pain, discomfort, or fever as directed by your caregiver.  Use a warm mist humidifier or inhale steam from a shower to increase air moisture. This may keep secretions moist and make it easier to breathe.  Drink enough water and fluids to keep your urine clear or pale yellow.  Rest as needed.  Return to work when your temperature has returned to normal or as your caregiver advises. You may need to stay home longer to avoid infecting others. You can also use a face mask and careful hand washing to prevent spread of the virus. SEEK MEDICAL CARE IF:   After the first few days, you feel you are getting worse rather than better.  You need your caregiver's advice about medicines to control symptoms.  You develop chills, worsening shortness of breath, or brown or red sputum. These may be signs of pneumonia.  You develop yellow or brown nasal discharge or pain in the face, especially when you bend forward. These may be signs of sinusitis.  You develop a fever, swollen neck glands, pain with swallowing, or white areas in the back of your throat. These may be signs of strep throat. SEEK IMMEDIATE MEDICAL CARE IF:   You have a fever.  You develop severe or persistent headache, ear pain, sinus pain, or chest pain.  You develop wheezing, a prolonged cough, cough up blood, or have a change in your usual mucus  (if you have chronic lung disease).  You develop sore muscles or a stiff neck. Document Released: 02/25/2001 Document Revised: 11/24/2011 Document Reviewed: 01/03/2011 [facility] Patient Information 2014 [place], [place]. Psoriasis Psoriasis is a long-lasting (chronic) skin problem. It can cause red bumps, a rash, scales that flake off, bleeding, and joint pain (arthritis). Psoriasis often affects the elbows, knees, groin, genitals, arms, legs, scalp, and nails. Psoriasis cannot be passed from person to person (not contagious).  HOME CARE  Only take medicine as told by your doctor.  Keep all doctor visits as told. You may need to try many treatments to find what works for you.  Avoid sunburn, cuts, scrapes, alcohol, smoking, and stress.  Wear gloves when you wash dishes, clean, or go outside in the cold.  Keep the air moist and cool in your home. You can use a humidifier.  Put lotion on right after a bath or shower.  Avoid long, hot baths and showers. Do not use a lot of soap.  Drink enough fluids to keep your pee (urine) clear or pale yellow. GET HELP RIGHT AWAY IF:   You have pain in the affected areas.  You have bleeding that does not stop in the affected areas.  You have more redness or warmth in the affected areas.  You have painful or stiff joints.  You feel depressed.  You have a fever. MAKE SURE YOU:  Understand these instructions.  Will watch your condition.  Will get help right away if you are not doing well or get worse. Document Released: 10/09/2004 Document Revised: 11/24/2011 Document Reviewed: 02/28/2011 [facility] Patient Information 2014 [place], [place]. Hernia A hernia happens when an organ inside your body pushes out through a weak spot in your belly (abdominal) wall. Most hernias get worse over time. They can often be pushed back into place (reduced). Surgery may be needed to repair hernias that cannot be pushed into place. HOME CARE  Keep doing normal  activities.  Avoid lifting more than 10 pounds (4.5 kilograms).  Cough gently and avoid straining. Over time, these things will:  Increase your hernia size.  Irritate your hernia.  Break down hernia repairs.  Stop smoking.  Do not wear anything tight over your hernia. Do not keep the hernia in with an outside bandage.  Eat food that is high in fiber (fruit, vegetables, whole grains).  Drink enough fluids to keep your pee (urine) clear or pale yellow.  Take medicines to make your poop soft (stool softeners) if you cannot poop (constipated). GET HELP RIGHT AWAY IF:   You have a fever.  You have belly pain that gets worse.  You feel sick to your stomach (nauseous) and throw up (vomit).  Your skin starts to bulge out.  Your hernia turns a different color, feels hard, or is tender.  You have increased pain or puffiness (swelling) around the hernia.  You poop more or less often.  Your poop does not look the way normally does.  You have watery poop (diarrhea).  You cannot push the hernia back in place by applying gentle pressure while lying down. MAKE SURE YOU:   Understand these instructions.  Will watch your condition.  Will get help right away if you are not doing well or get worse. Document Released: 02/19/2010 Document Revised: 11/24/2011 Document Reviewed: 02/19/2010 [facility] Patient Information 2014 [place], [place].

## 2013-06-08 NOTE — Progress Notes (Signed)
  Subjective:    Patient ID: Valerie Livingston, female    DOB: 1980/11/22, 32 y.o.   MRN: [id number]  HPI  Here for follow up: depression, psoriasis  Also concerned with tx of psoriasis (B elbows) And ?hernia repair before or after planned pregnancy ?  Past Medical History  Diagnosis Date  . Depression   . Sleep walking and eating     Review of Systems  Constitutional: Negative for fever, fatigue and unexpected weight change.  HENT: Positive for congestion, sore throat, rhinorrhea, postnasal drip and sinus pressure. Negative for ear pain, facial swelling, trouble swallowing and ear discharge.   Respiratory: Negative for cough and shortness of breath.   Gastrointestinal: Negative for nausea, abdominal pain, diarrhea, constipation and blood in stool.  Psychiatric/Behavioral: Negative for hallucinations, behavioral problems, dysphoric mood and decreased concentration. The patient is not nervous/anxious and is not hyperactive.        Objective:   Physical Exam BP 112/68  Pulse 82  Temp(Src) 98 F (36.7 C) (Oral)  Wt 136 lb (61.689 kg)  BMI 21.3 kg/m2  SpO2 97% Wt Readings from Last 3 Encounters:  06/08/13 136 lb (61.689 kg)  12/27/12 143 lb 6.4 oz (65.046 kg)  08/09/12 149 lb 1.9 oz (67.64 kg)   Constitutional: She appears well-developed and well-nourished. No distress.  HENT: NCAT, sinus nontender - nares with trace clear drainage - OP mildly erythematous, npo exudate or PND Neck: Normal range of motion. Neck supple. No JVD present. No thyromegaly present.  Cardiovascular: Normal rate, regular rhythm and normal heart sounds.  No murmur heard. No BLE edema. Pulmonary/Chest: Effort normal and breath sounds normal. No respiratory distress. She has no wheezes. Abdomen: small, reducible L inguinal hernia - no symptoms incarceration or pain Skin: B psoriasis plaques on elbows Psychiatric: She has a dysphoric mood and affect. Her behavior is normal. Judgment and thought content  normal.   Lab Results  Component Value Date   WBC 5.2 08/09/2012   HGB 13.6 08/09/2012   HCT 39.6 08/09/2012   PLT 213.0 08/09/2012   GLUCOSE 92 01/21/2013   CHOL 178 08/09/2012   TRIG 33.0 08/09/2012   HDL 51.90 08/09/2012   LDLCALC 120* 08/09/2012   ALT 16 08/09/2012   AST 19 08/09/2012   NA 136 01/21/2013   K 3.9 01/21/2013   CL 102 01/21/2013   CREATININE 0.8 01/21/2013   BUN 8 01/21/2013   CO2 28 01/21/2013   TSH 1.86 01/21/2013   HGBA1C 5.0 08/09/2012       Assessment & Plan:   See problem list. Medications and labs reviewed today.  URI - likely coral - Advised against antibiotics symptomatic care advised

## 2013-06-08 NOTE — Assessment & Plan Note (Signed)
Left side, mild hernia No complicating features Education provided - Pt will call or go to ER if red flag symptoms develop Otherwise, will complete pregnancy plans prior to repair

## 2013-06-21 ENCOUNTER — Encounter: Payer: Self-pay | Admitting: Internal Medicine

## 2013-06-29 LAB — OB RESULTS CONSOLE HEPATITIS B SURFACE ANTIGEN: Hepatitis B Surface Ag: NEGATIVE

## 2013-06-29 LAB — OB RESULTS CONSOLE ANTIBODY SCREEN: ANTIBODY SCREEN: NEGATIVE

## 2013-06-29 LAB — OB RESULTS CONSOLE GC/CHLAMYDIA
CHLAMYDIA, DNA PROBE: NEGATIVE
Gonorrhea: NEGATIVE

## 2013-06-29 LAB — OB RESULTS CONSOLE RUBELLA ANTIBODY, IGM: RUBELLA: IMMUNE

## 2013-06-29 LAB — OB RESULTS CONSOLE HIV ANTIBODY (ROUTINE TESTING): HIV: NONREACTIVE

## 2013-06-29 LAB — OB RESULTS CONSOLE ABO/RH: RH TYPE: POSITIVE

## 2013-06-29 LAB — OB RESULTS CONSOLE RPR: RPR: NONREACTIVE

## 2013-07-21 ENCOUNTER — Other Ambulatory Visit: Payer: Self-pay

## 2013-09-15 NOTE — L&D Delivery Note (Signed)
Delivery Note At 1:47 PM a viable female, "Valerie Livingston", was delivered via Vaginal, Spontaneous Delivery (Presentation: LOA, with compound presentation of left hand ;  ).  APGAR: 9, 10; weight .   Placenta status: Intact, Spontaneous.  Cord: 3 vessels with the following complications: None.  Patient received 2 doses of ATB prior to delivery.  Anesthesia: Epidural  Episiotomy: None Lacerations: 1st degree;Periurethral--2 stitches placed for hemostasis Suture Repair: 2.0 vicryl Est. Blood Loss (mL): 300 I&O cath after delivery for 300 cc clear urine  Mom to postpartum.  Baby to Couplet care / Skin to Skin.  Donnel Saxon 02/10/2014, 2:18 PM

## 2014-01-04 LAB — OB RESULTS CONSOLE GBS: STREP GROUP B AG: POSITIVE

## 2014-02-03 ENCOUNTER — Encounter (HOSPITAL_COMMUNITY): Payer: Self-pay | Admitting: *Deleted

## 2014-02-03 ENCOUNTER — Telehealth (HOSPITAL_COMMUNITY): Payer: Self-pay | Admitting: *Deleted

## 2014-02-03 NOTE — Telephone Encounter (Signed)
Preadmission screen  

## 2014-02-09 DIAGNOSIS — Z9889 Other specified postprocedural states: Secondary | ICD-10-CM | POA: Insufficient documentation

## 2014-02-09 DIAGNOSIS — B951 Streptococcus, group B, as the cause of diseases classified elsewhere: Secondary | ICD-10-CM | POA: Insufficient documentation

## 2014-02-09 DIAGNOSIS — O480 Post-term pregnancy: Principal | ICD-10-CM | POA: Insufficient documentation

## 2014-02-09 NOTE — H&P (Signed)
Valerie Livingston is a 33 y.o. female, G2P1001 at 36 3/7 weeks, presenting for induction of labor due to postdates.  Patient Active Problem List   Diagnosis Date Noted  . Post term pregnancy, delivered with or without mention of antepartum condition 02/09/2014  . Positive GBS test 02/09/2014  . H/O cone biopsy of cervix 2011 02/09/2014  . Inguinal hernia 06/08/2013  . Psoriasis   . Bipolar I disorder, most recent episode (or current) depressed, severe, without mention of psychotic behavior   . Depression 08/09/2012  No recent or current meds for depression/bipolar--no current issues per patient.  Feels she is not bipolar--dx was filed by Dr. Asa Lente, patient's primary MD.  Hx suicide attempt 2012, Dr. Toy Care as psychiatrist.  Was on meds prior to pregnancy, weaned off prior to pregnancy.  History of present pregnancy: Patient entered care at 8 1/7 weeks.   EDC of 02/07/14 was established by LMP and in agreement with [place] at 12 weeks.   Anatomy scan:  17 6/7 weeks, with normal findings and an anterior placenta.   Additional [place] evaluations:  None.   Significant prenatal events:  Declined treatment for positive enterobacter urine culture at [facility]B--repeat was negative.  Declined genetic testing.  Declined to discuss previous psych hx of ? Bipolar dx, suicide attempt 2012--was in care with Dr. Toy Care prior to pregnancy, weaned off all meds (previous use of Lexapro, SSRIs, Lithium, Lamictal, Lorazepam, Welbutrin--best combo for patient effect was Welbutrin + Lexapro.  Stopped all meds prior to pregnancy.  Requested induction prior to 5/31 due to change of insurance and desire for care by Windy Kalata, CNM.   Last evaluation:  02/02/14--cervical exam declined.  At previous visit on 5/13, cervix closed, 70%, vtx, -2.  OB History   Grav Para Term Preterm Abortions TAB SAB Ect Mult Living   2 1 1       1     2008--SVB, induction at 40 weeks, 12 hour labor, epidural, 7 lbs, female, delivered with Erling Conte  Past  Medical History  Diagnosis Date  . Depression     severe, seasonal sx - follows with Toy Care  . Sleep walking and eating   . Psoriasis   . Vaginal Pap smear, abnormal    Past Surgical History  Procedure Laterality Date  . Colposcopy    . Coccygectomy    Removal of coccyx in 2002, usually due to coccydynia.   Family History: family history includes Diabetes in her mother., and asthma.  Social History:  reports that she has never smoked. She does not have any smokeless tobacco history on file. She reports that she does not drink alcohol or use illicit drugs. Patient is from the [place], married, with supportive husband Henrene Dodge).  Patient is an Training and development officer and model, self-employed, college-educated.   Prenatal Transfer Tool  Maternal Diabetes: No Genetic Screening: Declined Maternal Ultrasounds/Referrals: Normal Fetal Ultrasounds or other Referrals:  None Maternal Substance Abuse:  No Significant Maternal Medications:  None Significant Maternal Lab Results: Lab values include: Group B Strep positive    ROS:  Occasional contractions, +FM.  No Known Allergies     Last menstrual period 04/15/2013.  Chest clear Heart RRR without murmur Abd gravid, NT, FH 39 cm Pelvic: Cervix 2-3, 100%, vtx, -1, IBOW Ext: WNL  FHR: Category 1 UCs:  Occasional, mild  Prenatal labs: ABO, Rh: O/Positive/-- (10/15 0000) Antibody: Negative (10/15 0000) Rubella:   Immune RPR: Nonreactive (10/15 0000)  HBsAg: Negative (10/15 0000)  HIV: Non-reactive (10/15 0000)  GBS:  Positive Sickle cell/Hgb electrophoresis:  NA Pap:  2013 GC:  Negative 06/29/13 Chlamydia: Negative 06/29/13 Genetic screenings:  Declined Glucola:  WNL Other:  Hgb 12.9 at NOB, 11.9 at 28 weeks.   Assessment/Plan: IUP at 40 3/7 weeks GBS positive Induction for postdates Favorable cervix  Plan: Admit to [place] per consult with Dr. Charlesetta Garibaldi Routine [facility] orders GBS prophylaxis Pitocin per  protocol Epidural prn.  Bubba Camp, [place] 02/09/2014, 7:58 PM

## 2014-02-10 ENCOUNTER — Other Ambulatory Visit (HOSPITAL_COMMUNITY): Payer: Self-pay | Admitting: Obstetrics and Gynecology

## 2014-02-10 ENCOUNTER — Encounter (HOSPITAL_COMMUNITY): Payer: Self-pay

## 2014-02-10 ENCOUNTER — Inpatient Hospital Stay (HOSPITAL_COMMUNITY)
Admission: RE | Admit: 2014-02-10 | Discharge: 2014-02-11 | DRG: 775 | Disposition: A | Discharge status: Home / Self Care | Payer: BC Managed Care – PPO | Source: Ambulatory Visit | Attending: Obstetrics and Gynecology | Admitting: Obstetrics and Gynecology

## 2014-02-10 ENCOUNTER — Inpatient Hospital Stay (HOSPITAL_COMMUNITY): Payer: BC Managed Care – PPO | Admitting: Anesthesiology

## 2014-02-10 ENCOUNTER — Encounter (HOSPITAL_COMMUNITY): Payer: BC Managed Care – PPO | Admitting: Anesthesiology

## 2014-02-10 VITALS — BP 120/77 | HR 69 | Temp 97.3°F | Resp 18 | Ht 67.0 in | Wt 182.0 lb

## 2014-02-10 DIAGNOSIS — O99344 Other mental disorders complicating childbirth: Secondary | ICD-10-CM | POA: Diagnosis present

## 2014-02-10 DIAGNOSIS — O99892 Other specified diseases and conditions complicating childbirth: Secondary | ICD-10-CM | POA: Diagnosis present

## 2014-02-10 DIAGNOSIS — Z9889 Other specified postprocedural states: Secondary | ICD-10-CM

## 2014-02-10 DIAGNOSIS — F319 Bipolar disorder, unspecified: Secondary | ICD-10-CM | POA: Diagnosis present

## 2014-02-10 DIAGNOSIS — O480 Post-term pregnancy: Principal | ICD-10-CM

## 2014-02-10 DIAGNOSIS — B951 Streptococcus, group B, as the cause of diseases classified elsewhere: Secondary | ICD-10-CM

## 2014-02-10 DIAGNOSIS — Z22330 Carrier of Group B streptococcus: Secondary | ICD-10-CM

## 2014-02-10 DIAGNOSIS — Z825 Family history of asthma and other chronic lower respiratory diseases: Secondary | ICD-10-CM

## 2014-02-10 DIAGNOSIS — O9989 Other specified diseases and conditions complicating pregnancy, childbirth and the puerperium: Secondary | ICD-10-CM

## 2014-02-10 DIAGNOSIS — Z833 Family history of diabetes mellitus: Secondary | ICD-10-CM

## 2014-02-10 DIAGNOSIS — F314 Bipolar disorder, current episode depressed, severe, without psychotic features: Secondary | ICD-10-CM

## 2014-02-10 DIAGNOSIS — O328XX0 Maternal care for other malpresentation of fetus, not applicable or unspecified: Secondary | ICD-10-CM | POA: Diagnosis present

## 2014-02-10 LAB — CBC
HEMATOCRIT: 37.2 % (ref 36.0–46.0)
HEMOGLOBIN: 12.7 g/dL (ref 12.0–15.0)
MCH: 31.4 pg (ref 26.0–34.0)
MCHC: 34.1 g/dL (ref 30.0–36.0)
MCV: 91.9 fL (ref 78.0–100.0)
Platelets: 179 10*3/uL (ref 150–400)
RBC: 4.05 MIL/uL (ref 3.87–5.11)
RDW: 13.1 % (ref 11.5–15.5)
WBC: 10.1 10*3/uL (ref 4.0–10.5)

## 2014-02-10 LAB — TYPE AND SCREEN
ABO/RH(D): O POS
Antibody Screen: NEGATIVE

## 2014-02-10 LAB — ABO/RH: ABO/RH(D): O POS

## 2014-02-10 LAB — RPR

## 2014-02-10 MED ORDER — ONDANSETRON HCL 4 MG/2ML IJ SOLN: 4.0000 mg | Freq: Four times a day (QID) | INTRAMUSCULAR | Status: DC | PRN

## 2014-02-10 MED ORDER — ZOLPIDEM TARTRATE 5 MG PO TABS: 5.0000 mg | ORAL_TABLET | Freq: Every evening | ORAL | Status: DC | PRN

## 2014-02-10 MED ORDER — DIPHENHYDRAMINE HCL 25 MG PO CAPS: 25.0000 mg | ORAL_CAPSULE | Freq: Four times a day (QID) | ORAL | Status: DC | PRN

## 2014-02-10 MED ORDER — PHENYLEPHRINE 40 MCG/ML (10ML) SYRINGE FOR IV PUSH (FOR BLOOD PRESSURE SUPPORT)
80.0000 ug | PREFILLED_SYRINGE | INTRAVENOUS | Status: DC | PRN
  Filled 2014-02-10: qty 2
  Filled 2014-02-10: qty 10

## 2014-02-10 MED ORDER — IBUPROFEN 600 MG PO TABS: 600.0000 mg | ORAL_TABLET | Freq: Four times a day (QID) | ORAL | Status: DC | PRN

## 2014-02-10 MED ORDER — OXYTOCIN BOLUS FROM INFUSION: 500.0000 mL | INTRAVENOUS | Status: DC

## 2014-02-10 MED ORDER — TERBUTALINE SULFATE 1 MG/ML IJ SOLN: 0.2500 mg | Freq: Once | INTRAMUSCULAR | Status: DC | PRN

## 2014-02-10 MED ORDER — BENZOCAINE-MENTHOL 20-0.5 % EX AERO
1.0000 | INHALATION_SPRAY | CUTANEOUS | Status: DC | PRN
Start: 2014-02-10 — End: 2014-02-11

## 2014-02-10 MED ORDER — SIMETHICONE 80 MG PO CHEW: 80.0000 mg | CHEWABLE_TABLET | ORAL | Status: DC | PRN

## 2014-02-10 MED ORDER — PRENATAL MULTIVITAMIN CH
1.0000 | ORAL_TABLET | Freq: Every day | ORAL | Status: DC
  Administered 2014-02-11: 1 via ORAL
  Filled 2014-02-10: qty 1

## 2014-02-10 MED ORDER — EPHEDRINE 5 MG/ML INJ
10.0000 mg | INTRAVENOUS | Status: DC | PRN
  Filled 2014-02-10: qty 2

## 2014-02-10 MED ORDER — BUTORPHANOL TARTRATE 1 MG/ML IJ SOLN: 1.0000 mg | INTRAMUSCULAR | Status: DC | PRN

## 2014-02-10 MED ORDER — OXYTOCIN 40 UNITS IN LACTATED RINGERS INFUSION - SIMPLE MED
62.5000 mL/h | INTRAVENOUS | Status: DC
Start: 2014-02-10 — End: 2014-02-10

## 2014-02-10 MED ORDER — EPHEDRINE 5 MG/ML INJ
10.0000 mg | INTRAVENOUS | Status: DC | PRN
  Filled 2014-02-10: qty 2
  Filled 2014-02-10: qty 4

## 2014-02-10 MED ORDER — ONDANSETRON HCL 4 MG/2ML IJ SOLN: 4.0000 mg | INTRAMUSCULAR | Status: DC | PRN

## 2014-02-10 MED ORDER — OXYCODONE-ACETAMINOPHEN 5-325 MG PO TABS: 1.0000 | ORAL_TABLET | ORAL | Status: DC | PRN

## 2014-02-10 MED ORDER — FENTANYL 2.5 MCG/ML BUPIVACAINE 1/10 % EPIDURAL INFUSION (WH - ANES)
INTRAMUSCULAR | Status: DC | PRN
  Administered 2014-02-10: 14 mL/h via EPIDURAL

## 2014-02-10 MED ORDER — FENTANYL 2.5 MCG/ML BUPIVACAINE 1/10 % EPIDURAL INFUSION (WH - ANES)
14.0000 mL/h | INTRAMUSCULAR | Status: DC | PRN
  Filled 2014-02-10: qty 125

## 2014-02-10 MED ORDER — LIDOCAINE HCL (PF) 1 % IJ SOLN
30.0000 mL | INTRAMUSCULAR | Status: DC | PRN
  Administered 2014-02-10: 30 mL via SUBCUTANEOUS
  Filled 2014-02-10: qty 30

## 2014-02-10 MED ORDER — TETANUS-DIPHTH-ACELL PERTUSSIS 5-2.5-18.5 LF-MCG/0.5 IM SUSP: 0.5000 mL | Freq: Once | INTRAMUSCULAR | Status: DC

## 2014-02-10 MED ORDER — MISOPROSTOL 25 MCG QUARTER TABLET
25.0000 ug | ORAL_TABLET | ORAL | Status: DC | PRN
  Filled 2014-02-10: qty 1

## 2014-02-10 MED ORDER — FLEET ENEMA 7-19 GM/118ML RE ENEM: 1.0000 | ENEMA | RECTAL | Status: DC | PRN

## 2014-02-10 MED ORDER — OXYTOCIN 40 UNITS IN LACTATED RINGERS INFUSION - SIMPLE MED
1.0000 m[IU]/min | INTRAVENOUS | Status: DC
  Administered 2014-02-10: 2 m[IU]/min via INTRAVENOUS
  Filled 2014-02-10: qty 1000

## 2014-02-10 MED ORDER — LACTATED RINGERS IV SOLN: 500.0000 mL | INTRAVENOUS | Status: DC | PRN

## 2014-02-10 MED ORDER — SENNOSIDES-DOCUSATE SODIUM 8.6-50 MG PO TABS
2.0000 | ORAL_TABLET | ORAL | Status: DC
  Filled 2014-02-10: qty 2

## 2014-02-10 MED ORDER — LACTATED RINGERS IV SOLN
INTRAVENOUS | Status: DC
  Administered 2014-02-10: 07:00:00 via INTRAVENOUS

## 2014-02-10 MED ORDER — IBUPROFEN 600 MG PO TABS
600.0000 mg | ORAL_TABLET | Freq: Four times a day (QID) | ORAL | Status: DC
  Administered 2014-02-10 – 2014-02-11 (×2): 600 mg via ORAL
  Filled 2014-02-10 (×4): qty 1

## 2014-02-10 MED ORDER — DIBUCAINE 1 % RE OINT: 1.0000 "application " | TOPICAL_OINTMENT | RECTAL | Status: DC | PRN

## 2014-02-10 MED ORDER — LANOLIN HYDROUS EX OINT: TOPICAL_OINTMENT | CUTANEOUS | Status: DC | PRN

## 2014-02-10 MED ORDER — ONDANSETRON HCL 4 MG PO TABS: 4.0000 mg | ORAL_TABLET | ORAL | Status: DC | PRN

## 2014-02-10 MED ORDER — LACTATED RINGERS IV SOLN: 500.0000 mL | Freq: Once | INTRAVENOUS | Status: DC

## 2014-02-10 MED ORDER — WITCH HAZEL-GLYCERIN EX PADS: 1.0000 "application " | MEDICATED_PAD | CUTANEOUS | Status: DC | PRN

## 2014-02-10 MED ORDER — ACETAMINOPHEN 325 MG PO TABS: 650.0000 mg | ORAL_TABLET | ORAL | Status: DC | PRN

## 2014-02-10 MED ORDER — CITRIC ACID-SODIUM CITRATE 334-500 MG/5ML PO SOLN: 30.0000 mL | ORAL | Status: DC | PRN

## 2014-02-10 MED ORDER — LIDOCAINE HCL (PF) 1 % IJ SOLN
INTRAMUSCULAR | Status: DC | PRN
  Administered 2014-02-10 (×2): 8 mL

## 2014-02-10 MED ORDER — DIPHENHYDRAMINE HCL 50 MG/ML IJ SOLN: 12.5000 mg | INTRAMUSCULAR | Status: DC | PRN

## 2014-02-10 MED ORDER — PENICILLIN G POTASSIUM 5000000 UNITS IJ SOLR
2.5000 10*6.[IU] | INTRAVENOUS | Status: DC
  Administered 2014-02-10: 2.5 10*6.[IU] via INTRAVENOUS
  Filled 2014-02-10 (×5): qty 2.5

## 2014-02-10 MED ORDER — PENICILLIN G POTASSIUM 5000000 UNITS IJ SOLR
5.0000 10*6.[IU] | Freq: Once | INTRAVENOUS | Status: AC
  Administered 2014-02-10: 5 10*6.[IU] via INTRAVENOUS
  Filled 2014-02-10: qty 5

## 2014-02-10 MED ORDER — PHENYLEPHRINE 40 MCG/ML (10ML) SYRINGE FOR IV PUSH (FOR BLOOD PRESSURE SUPPORT)
80.0000 ug | PREFILLED_SYRINGE | INTRAVENOUS | Status: DC | PRN
  Filled 2014-02-10: qty 2

## 2014-02-10 NOTE — Anesthesia Procedure Notes (Signed)
Epidural Patient location during procedure: OB Start time: 02/10/2014 10:43 AM End time: 02/10/2014 10:47 AM  Staffing Anesthesiologist: Lyn Hollingshead Performed by: anesthesiologist   Preanesthetic Checklist Completed: patient identified, surgical consent, pre-op evaluation, timeout performed, IV checked, risks and benefits discussed and monitors and equipment checked  Epidural Patient position: sitting Prep: site prepped and draped and DuraPrep Patient monitoring: continuous pulse ox and blood pressure Approach: midline Location: L3-L4 Injection technique: LOR air  Needle:  Needle type: Tuohy  Needle gauge: 17 G Needle length: 9 cm and 9 Needle insertion depth: 4 cm Catheter type: closed end flexible Catheter size: 19 Gauge Catheter at skin depth: 9 cm Test dose: negative and Other  Assessment Sensory level: T9 Events: blood not aspirated, injection not painful, no injection resistance, negative IV test and no paresthesia  Additional Notes Reason for block:procedure for pain

## 2014-02-10 NOTE — Progress Notes (Signed)
  Subjective: Comfortable overall, but more aware of contractions now  Objective: BP 120/82  Pulse 90  Temp(Src) 97.8 F (36.6 C) (Oral)  Resp 16  Ht 5\' 7"  (1.702 m)  Wt 182 lb (82.555 kg)  BMI 28.50 kg/m2  LMP 04/15/2013    1st dose PCN infusing  FHT: Category 1 UC:   Irregular, some irritability SVE:   Deferred at present Pitocin at 10 mu/min  Assessment:  Induction of labor, post-dates GBS positive  Plan: Continue pitocin induction. Epidural prn. Plan AROM after epidural placed.  Donnel Saxon CNM, MS 02/10/2014, 10:07 AM

## 2014-02-10 NOTE — Anesthesia Preprocedure Evaluation (Signed)
Anesthesia Evaluation  Patient identified by MRN, date of birth, ID band Patient awake    Reviewed: Allergy & Precautions, H&P , NPO status , Patient's Chart, lab work & pertinent test results  Airway Mallampati: I TM Distance: >3 FB Neck ROM: full    Dental no notable dental hx.    Pulmonary neg pulmonary ROS,    Pulmonary exam normal       Cardiovascular negative cardio ROS      Neuro/Psych negative neurological ROS     GI/Hepatic negative GI ROS, Neg liver ROS,   Endo/Other  negative endocrine ROS  Renal/GU negative Renal ROS  negative genitourinary   Musculoskeletal negative musculoskeletal ROS (+)   Abdominal Normal abdominal exam  (+)   Peds  Hematology negative hematology ROS (+)   Anesthesia Other Findings   Reproductive/Obstetrics (+) Pregnancy                           Anesthesia Physical Anesthesia Plan  ASA: II  Anesthesia Plan: Epidural   Post-op Pain Management:    Induction:   Airway Management Planned:   Additional Equipment:   Intra-op Plan:   Post-operative Plan:   Informed Consent: I have reviewed the patients History and Physical, chart, labs and discussed the procedure including the risks, benefits and alternatives for the proposed anesthesia with the patient or authorized representative who has indicated his/her understanding and acceptance.     Plan Discussed with:   Anesthesia Plan Comments:         Anesthesia Quick Evaluation

## 2014-02-11 ENCOUNTER — Encounter (HOSPITAL_COMMUNITY): Payer: Self-pay

## 2014-02-11 LAB — CBC
HCT: 37.1 % (ref 36.0–46.0)
Hemoglobin: 12.5 g/dL (ref 12.0–15.0)
MCH: 31.7 pg (ref 26.0–34.0)
MCHC: 33.7 g/dL (ref 30.0–36.0)
MCV: 94.2 fL (ref 78.0–100.0)
PLATELETS: 161 10*3/uL (ref 150–400)
RBC: 3.94 MIL/uL (ref 3.87–5.11)
RDW: 13.1 % (ref 11.5–15.5)
WBC: 13.4 10*3/uL — ABNORMAL HIGH (ref 4.0–10.5)

## 2014-02-11 NOTE — Discharge Summary (Signed)
  Vaginal Delivery Discharge Summary  Valerie Livingston  DOB:    [birth year] MRN:    [id number] CSN:    [id number]  Date of admission:                  02/10/14  Date of discharge:                   02/11/14  Procedures this admission:   SVB  Date of Delivery: 02/11/14  Newborn Data:  Live born female  Birth Weight: 6 lb 15 oz (3147 g) APGAR: 9, 10  Home with mother. Name: Valerie Livingston   History of Present Illness:  Ms. Valerie Livingston is a 33 y.o. female, G2P2002, who presents at [redacted]w[redacted]d weeks gestation. The patient has been followed at the [facility] and Gynecology division of [facility]. She was admitted induction of labor due to postdates. Her pregnancy has been complicated by:  Patient Active Problem List   Diagnosis Date Noted  . Vaginal delivery 02/10/2014  . Post term pregnancy, delivered with or without mention of antepartum condition 02/09/2014  . Positive GBS test 02/09/2014  . H/O cone biopsy of cervix 2011 02/09/2014  . Inguinal hernia--mild, left side. 06/08/2013  . Psoriasis   . Bipolar I disorder, most recent episode (or current) depressed, severe, without mention of psychotic behavior   . Depression 08/09/2012     Hospital Course:  Admitted 5/29 15 for induction due to postdates and favorable cervix. Positive GBS. Progressed well after pitocin begun. Utilized epidural for pain management.  Delivery was performed by Donnel Saxon, CNM, without complication. Patient and baby tolerated the procedure without difficulty, with  right 1st degree periurethral laceration noted. Infant status was stable and remained in room with mother.  Mother and infant then had an uncomplicated postpartum course, with breast feeding going well. Mom's physical exam was WNL, and she was discharged home in stable condition. Contraception plan was undecided at present.  She received adequate benefit from po pain medications and declined Rx pain meds at d/c.  She  desired early d/c on day 1.   Feeding:  breast  Contraception:  Undecided  Discharge hemoglobin:  Hemoglobin  Date Value Ref Range Status  02/11/2014 12.5  12.0 - 15.0 g/dL Final     HCT  Date Value Ref Range Status  02/11/2014 37.1  36.0 - 46.0 % Final    Discharge Physical Exam:   General: alert Lochia: appropriate Uterine Fundus: firm Incision: healing well DVT Evaluation: No evidence of DVT seen on physical exam. Negative Homan's sign.  Intrapartum Procedures: spontaneous vaginal delivery and GBS prophylaxis Postpartum Procedures: none Complications-Operative and Postpartum: 1st degree right periurethral laceration  Discharge Diagnoses: Term Pregnancy-delivered  Discharge Information:  Activity:           pelvic rest Diet:                routine Medications: None--patient declined. Condition:      stable Instructions:     Discharge to: home  Follow-up Information   Follow up with [facility]. Schedule an appointment as soon as possible for a visit in 6 weeks. (Call for any questions or concerns)    Specialty:  Obstetrics and Gynecology   Contact information:   [address] [place] [address] [phone]       Kylor Valverde CNM, [place] 02/11/2014 8:19 AM

## 2014-02-11 NOTE — Progress Notes (Signed)
Clinical Social Work Department PSYCHOSOCIAL ASSESSMENT - MATERNAL/CHILD 02/11/2014  Patient:  Valerie Livingston, Valerie Livingston  Account Number:  [id number]  [place] Date:  02/10/2014  Ardine Eng Name:   Valerie Livingston [facility]    Clinical Social Worker:  Brenden Rudman, LCSW   Date/Time:  02/11/2014 11:45 AM  Date Referred:  02/11/2014   Referral source  Central Nursery     Referred reason  Depression/Anxiety   Other referral source:    I:  FAMILY / [place] legal guardian:  PARENT  Guardian - Name Guardian - Age Guardian - Address  [address], [place] [address]  Marletta Lor  same as above   Other household support members/support persons Other support:    II  PSYCHOSOCIAL DATA Information Source:    Occupational hygienist Employment:   Both parents employed   Museum/gallery curator resources:  Multimedia programmer If [place] / Grade:   Maternity Care Coordinator / Child Services Coordination / Early Interventions:  Cultural issues impacting care:    III  STRENGTHS Strengths  Supportive family/friends  Home prepared for Child (including basic supplies)  Adequate Resources   Strength comment:    IV  RISK FACTORS AND CURRENT PROBLEMS Current Problem:       V  SOCIAL WORK ASSESSMENT Acknowledged order for Social Work consult to assess mother's history of bipolar.  Mother was receptive to social work intervention.   Informed that she was diagnosed with depression not bipolar.  Parents are married with one other dependent age 69.   Mother states that she has been off medication since October 2014 and has a psychiatrist and counselor that she works with on an as needed basis. Informed that she has not had any symptoms during the pregnancy and denies current symptoms of depression or anxiety.  She denies any hx of substance abuse.   No acute social concerns noted at this time.  Mother informed of social work Fish farm manager.       VI SOCIAL WORK PLAN Social Work Plan  No Further Intervention Required / No Barriers to Discharge

## 2014-02-11 NOTE — Discharge Instructions (Signed)

## 2014-02-11 NOTE — Anesthesia Postprocedure Evaluation (Signed)
Anesthesia Post Note  Patient: Valerie Livingston  Procedure(s) Performed: * No procedures listed *  Anesthesia type: Epidural  Patient location: Mother/Baby  Post pain: Pain level controlled  Post assessment: Post-op Vital signs reviewed  Last Vitals:  Filed Vitals:   02/11/14 0535  BP: 120/77  Pulse: 69  Temp: 36.3 C  Resp: 18    Post vital signs: Reviewed  Level of consciousness:alert  Complications: No apparent anesthesia complications

## 2014-02-13 NOTE — Progress Notes (Signed)
Post discharge ur review completed. 

## 2014-07-17 ENCOUNTER — Encounter (HOSPITAL_COMMUNITY): Payer: Self-pay

## 2015-01-07 NOTE — H&P (Signed)
PATIENT NAME:  Valerie Livingston, Valerie Livingston MR#:  [id number] DATE OF BIRTH:  [birth year]  DATE OF ADMISSION:  12/08/2011  PRIMARY CARE PHYSICIAN: Unknown.   PSYCHIATRIST: Charlie Pitter, MD  THERAPIST: Samuella Bruin  HISTORY OF PRESENT ILLNESS: The patient is a 34 year old Caucasian female with past medical history significant for history of depression who presented to the hospital with altered mental status. Apparently she was found at home by her husband, according to the emergency room physician, with altered mental status. She was able to tell her husband that she took extra medications and she took approximately 10 pills of Lexapro as well as 3 pills of some unknown medication at around 3:00 p.m. as she wanted to sleep. Her speech was slurred and she was brought to the Emergency Room for further evaluation. She remains very somnolent and not able to provide much history.  PAST MEDICAL HISTORY:  1. Depression and anxiety. 2. 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 Jacquelyne Balint.  MEDICATIONS: Diazepam and Lexapro, unknown doses.  PAST SURGICAL HISTORY: Unknown.   ALLERGIES: Unknown; however, according to medical records, the patient does not have any known allergies.  FAMILY HISTORY: The patient's mother has depression.   SOCIAL HISTORY: The patient lives with her husband. She has a 66 year old daughter. No smoking, no alcohol abuse. Arts development officer.   REVIEW OF SYSTEMS: Not available as the patient is confused, somnolent, and has slurring of speech and I am not able to understand what she is saying.   PHYSICAL EXAMINATION:   VITALS: On arrival to the emergency room, her temperature was 97.5, pulse 79, respiration rate 20, blood pressure 190/87, and saturation 99% on room air.   GENERAL: This is a well-developed, well-nourished Caucasian female in no significant distress lying on the stretcher.   HEENT: Pupils are equally round and reactive to light. Extraocular  movements are intact. No icterus or conjunctivitis.  She has normal hearing. No pharyngeal erythema. Mucosa is dry.   NECK: No masses, supple and nontender. Thyroid is not enlarged. No adenopathy. No JVD or carotid bruits bilaterally. Full range of motion.   LUNGS: Clear to auscultation in all fields. No rales or rhonchi. Somewhat diminished breath sounds were noted; however, the patient has very poor inspiratory effort. No labored inspirations, increased effort, dullness to percussion, or overt respiratory distress.   HEART: S1 and S2 appreciated. No murmurs, gallops, or rubs are noted. PMI is not lateralized. Chest is nontender to palpation.   EXTREMITIES: 1+ pedal pulses. No lower extremity, clubbing or cyanosis was noted.   ABDOMEN: Soft and nontender. Bowel sounds are present. No hepatosplenomegaly or masses were noted.   RECTAL: Deferred.   MUSCULOSKELETAL: Able to move all extremities. No cyanosis, degenerative joint disease, or kyphosis. Gait is not tested.   SKIN: Skin did not reveal any rashes, lesions, erythema, nodularity, or induration. It was warm and dry to palpation.   LYMPH: No adenopathy in the cervical region.   NEUROLOGICAL: Cranial nerves grossly intact. The patient does have nystagmus. Sensory grossly intact. The patient does have dysarthria and slurring of speech, but no aphasia. The patient is very somnolent, poorly oriented, and poorly cooperative. Memory is impaired.   LABS/STUDIES: BMP showed glucose 103, otherwise unremarkable study. The patient's total protein is elevated to 8.4, otherwise liver enzymes are normal. TSH is normal at 2.69. Urine drug screen is negative. CBC within normal limits.   Urinalysis: Remarkable for 4 white blood cells, but otherwise unremarkable.  Acetaminophen  level is less than 2.0. Salicylate level is less than 1.7. Pregnancy test in urine was negative.   EKG showed normal sinus rhythm at a rate of 86 beats per minute, normal axis,  possible left atrial enlargement, borderline EKG. No acute ST-T changes were noted.   RADIOLOGIC STUDIES: CT scan of the head without contrast revealed no acute intracranial process.   ASSESSMENT AND PLAN:  1. Encephalopathy, likely metabolic: Admit the patient to the medical floor, off unit telemetry. Continue her sitter. Continue neuro checks frequently as well as psychiatric consult when the patient is more clear. Very likely the patient's encephalopathy is related to overdose of medications. I am surprised the patient's urine drug screen is negative for benzodiazepines.  2. Suspected overdose with medications: We will continue the patient on IV fluids. We will also add activated charcoal. 3. Hyperglycemia: Check the patient's Hemoglobin A1c. 4. History of depression and anxiety: Get psychiatrist involved. Hold the patient's medication, Lexapro, because of her overdose.  5. Questionable urinary tract infection: Get urine cultures.           6. Likely dehydration: Continue the patient on IV fluids.   TIME SPENT: One hour.  ____________________________ Theodoro Grist, MD rv:slb D: 12/08/2011 18:13:25 ET T: 12/09/2011 07:08:43 ET JOB#: [id number]  cc: Theodoro Grist, MD, <Dictator> Iyanni Hepp MD ELECTRONICALLY SIGNED 12/17/2011 13:58

## 2015-01-07 NOTE — Discharge Summary (Signed)
PATIENT NAME:  Valerie Livingston, FOLDS MR#:  [id number] DATE OF BIRTH:  1980/10/29  DATE OF ADMISSION:  12/08/2011 DATE OF DISCHARGE:  12/09/2011  DISCHARGE DIAGNOSES:  1. Metabolic encephalopathy, now resolved, likely due to drug overdose.  2. Drug overdose requiring inpatient psychiatry treatment.  3. Abnormal urinalysis. No urinary tract infection. Urine culture remained contaminated. No urinary symptoms.  4. Depression, managed by Psychiatry.   SECONDARY DIAGNOSIS: Depression/anxiety.   CONSULTATION: Psychiatry, Dr. Rhona Raider   PROCEDURE/RADIOLOGY: CT scan of the head without contrast on March 25th showed no acute intracranial process.   MAJOR LABORATORY PANEL: Urinalysis on admission showed 4 WBCs, trace bacteria, trace leukocyte esterase, otherwise negative. Urine culture remained contaminated with 10,000 colonies of Escherichia coli. Serum Tylenol level less than 2. Salicylate level less than 1.7. Lamictal level 1.9 which was low. Urine pregnancy test was negative.   HISTORY AND SHORT HOSPITAL COURSE: The patient is a 34 year old female with the above-mentioned medical problems who was admitted for drug overdose. She took 10 pills of Lexapro and 3 pills of some other medication. Please see Dr. Keenan Bachelor dictated history and physical for further details. Psychiatry consult was obtained with Dr. Rhona Raider. In 24 hours of medical observation she was back to her baseline. She patient did not go to inpatient behavioral medicine. She was discharged home as psychiatrist cleared her from their perspective.   On discharge her vital signs were as follows: Temperature 99.2, heart rate 95 per minute, respirations 18 per minute, blood pressure 107/69 mmHg. She was saturating 97% on room air.   PERTINENT PHYSICAL EXAMINATION: CARDIOVASCULAR: S1, S2 normal. No murmur, rubs, or gallop. LUNGS: Clear to auscultation bilaterally. No wheezes, rales, rhonchi, or crepitation. ABDOMEN: Soft, benign. NEUROLOGIC:  Nonfocal examination. All other physical examination remained at the baseline.   DISCHARGE MEDICATIONS: Folic acid 1 mg p.o. daily.   DISCHARGE DIET: Low sodium.   DISCHARGE ACTIVITY: As tolerated.   DISCHARGE INSTRUCTIONS AND FOLLOW-UP: The patient was instructed to follow-up with her primary care physician in 1 to 2 weeks with psychiatric doctor, Dr. Charlie Pitter, in 5 to 7 days and also with Dr. Samuella Bruin, psychotherapist as scheduled.  TOTAL TIME DISCHARGING THIS PATIENT: 45 minutes.   The patient was discharged home as the patient was medically and psychiatrically wise cleared.   ____________________________ Aulton Routt S. Manuella Ghazi, MD vss:drc D: 12/11/2011 14:03:55 ET T: 12/13/2011 14:15:45 ET JOB#: [id number]  cc: Devron Cohick S. Manuella Ghazi, MD, <Dictator> Drue Stager. [place], MD Dr. Charlie Pitter  Lucina Mellow [facility] MD ELECTRONICALLY SIGNED 12/14/2011 22:39

## 2015-01-07 NOTE — Consult Note (Signed)
PATIENT NAME:  Valerie Livingston, Valerie Livingston MR#:  [id number] DATE OF BIRTH:  August 12, 1981  DATE OF CONSULTATION:  12/09/2011  REFERRING PHYSICIAN:  Max Sane, MD CONSULTING PHYSICIAN:  Drue Stager. Winchester, MD  REASON FOR CONSULTATION: History of depression, rule out suicide attempt with intentional overdose.   HISTORY OF PRESENT ILLNESS: Valerie Livingston is a 34 year old married female admitted to the [facility] on 12/08/2011 after being found down by her husband. She told her husband that she took 10 pills of Lexapro and 3 pills of some other medication. She was initially a poor historian and was very somnolent. She was admitted to the general medical floor for clearance.   By the day of the undersigns visit, the patient denies any intention on overdosing. She has impaired memory for the event. At the time of admission, she told the staff that she was wanting to sleep by taking the medications. Today she denies any history of ever attempting suicide.   She describes in an open-ended conversation before leading questions are used, interest and constructive goals for the future. She has no thoughts of harming herself or others. She has no hallucinations or delusions. Her orientation and memory function are intact, except for the overdose blackout. She did not show any benzodiazepine in her urinalysis.   She states that she had reentered a major depression four weeks ago with depressed mood, decreased energy, difficulty concentrating, and decreased interest. At that time, she presented to her psychiatrist. He started lamotrigine two weeks ago; however, the patient only took the lamotrigine for two days then discontinued it due to concerns about it causing a rash. Also she stated that she and her husband wanted to proceed with a third pregnancy and she did not want to be taking any psychotropic medication during the pregnancy at all.   PAST PSYCHIATRIC HISTORY: She denies any history of  elevated mood, racing thoughts, increased energy, or decreased need for sleep. She denies any history of suicide attempts.   She states that she has been admitted to a psychiatric hospital in [place] 10 years ago when she developed her first major depressive episode of over two weeks involving depressed mood, poor energy, difficulty concentrating, anhedonia, hopelessness, helplessness, and insomnia. She spent several weeks in the hospital at that time and was treated with Prozac. Upon a period of remission, the Prozac was discontinued.   Her next major depressive episode was approximately eight months ago. She was seen by Dr. Vic Blackbird in [place], [place]. He started her on Lexapro with good results. She stayed on the Lexapro for approximately four months. It was then tapered off.   She redeveloped depression in the late fall. At that time, when she restarted Lexapro, she was having some photosensitivity as well as some unusual decreased energy. She attributed it to the Lexapro.   When the undersigned discussed the two medications, Lexapro versus lamotrigine, the patient realized that she had been using the term Lexapro recently substituting that name for lamotrigine.   She has no history of hallucinations or delusions.   FAMILY PSYCHIATRIC HISTORY: Her mother has a history of depression.   SOCIAL HISTORY: Valerie Livingston achieved a college scholarship to the [place] and immigrated. She has two children. She is married to a [place] and they live in [place].   Her husband works full-time. 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 She denies any history of alcohol, illegal drugs, or tobacco. She has a  supportive relationship with her husband.   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   ALLERGIES: No known drug allergies.   CURRENT MEDICATIONS:  1. Zofran 4 mg every four hours p.r.n.   2. Activated charcoal.   LABS/STUDIES: SGOT 12, SGPT 12, BUN 6, and creatinine 0.73. Platelet count normal. Hemoglobin normal. WBC normal.   Head CT without contrast shows no acute intracranial process.   Urine pregnancy test negative.  The initial urinalysis on 12/08/2011 was positive for amphetamines and positive for benzodiazepines.   Her Lamictal level was only 1.9, which is negligible. Ammonia was less than 25. TSH normal. Aspirin negative. Ethanol negative. Acetaminophen negative.   REVIEW OF SYSTEMS: Constitutional, head, eyes, ears, nose, throat, mouth, neurologic, cardiovascular, respiratory, gastrointestinal, genitourinary, skin, musculoskeletal, hematologic, lymphatic, endocrine, and metabolic all unremarkable. Psychiatric: Again the patient denies any history of substance abuse.   PHYSICAL EXAMINATION:   VITAL SIGNS: Temperature 99.2, pulse 95, respiratory rate 18, and blood pressure 107/69.   GENERAL APPEARANCE: Valerie Livingston is a young female partially reclined in a hospital bed with no abnormal involuntary movements. She has no cachexia. Her muscle tone is normal. Grooming is normal. Hygiene is normal.   MENTAL STATUS: Valerie Livingston is alert. Her eye contact is good. Her concentration is normal. She is oriented to all spheres. Her memory is intact to immediate, recent, and remote except for the overdose blackout. Her fund of knowledge, use of language and intelligence are normal. Speech - she has a slight [place] accent. There is no dysarthria. She has normal prosody. Thought process is logical, coherent, and goal directed. No looseness of associations. No tangents. Thought content - no thoughts of harming herself or others. No delusions or hallucinations. She has intact interests and constructive future goals. Her affect is slightly flat at baseline, but with a broad and appropriate range. Mood is within normal limits. Insight is intact. Judgment is intact.   ASSESSMENT:    AXIS I:  1. Major depressive disorder, recurrent, in partial remission.  2. A urine drug screen positive for amphetamines and benzodiazepines with the patient's history not consistent with this finding.   AXIS II: Deferred.   AXIS III: Status post delirium not otherwise specified, history of cervical dysplasia.  AXIS IV: General medical.   AXIS V: 55.   Valerie Livingston is not at risk to harm herself or others. She agrees to call emergency services immediately for any thoughts of harming herself, thoughts of harming others, or distress.   Until proven otherwise, the patient's history takes precedence over a urine drug screen; however, the patient may have had a polysubstance-induced delirium. In these types of syndromes, short-term memory recall becomes impaired very quickly under the presence of benzodiazepines such as Valium. When that happens and the person is intending on further sedation, they will not be able to recall how much they have already taken because they will already be in an anterograde amnesia. This is only a possibility which would allow for the patient's history being truthful and a substance overdose having occurred.   Also it is important to discuss how substance abuse such as cocaine can precipitate acute delirium as well as acute depression.   Regardless, the patient has recovered from her state of intoxication and delirium. She does not meet any criteria for commitment. She wants to pursue outpatient psychiatric care.   Regarding any substance abuse treatment, she does not acknowledge substance abuse. Therefore, she will not  be scheduled for substance abuse treatment.   Given her current stable mood, we will not prescribe psychotropic agents at this time.   We will leave the decision of further psychotropic care between the patient and her outpatient psychiatrist.   The patient will follow-up with her outpatient psychiatrist within 5 to 7 days of discharge.   The  importance of psychotherapy in treating and preventing mild to moderate depression was emphasized with the patient. She has not followed through with recommended psychotherapy in     the past. She agrees to contact her insurance panel to facilitate psychotherapy with a therapist on her panel. This has been recommended by her outpatient psychiatrist, Dr. Sheppard Evens, psychiatrist at  [facility].  ____________________________ Drue Stager. Laquida Cotrell, MD jsw:[id number] D: 12/09/2011 21:12:51 ET T: 12/10/2011 08:46:44 ET JOB#: [id number]  cc: Drue Stager. Caetano Oberhaus, MD, <Dictator> Billie Ruddy MD ELECTRONICALLY SIGNED 12/12/2011 10:18

## 2017-01-28 ENCOUNTER — Ambulatory Visit (INDEPENDENT_AMBULATORY_CARE_PROVIDER_SITE_OTHER): Payer: 59 | Admitting: Nurse Practitioner

## 2017-01-28 ENCOUNTER — Encounter: Payer: Self-pay | Admitting: Nurse Practitioner

## 2017-01-28 VITALS — BP 98/68 | HR 61 | Temp 97.8°F | Ht 67.0 in | Wt 153.0 lb

## 2017-01-28 DIAGNOSIS — Z13220 Encounter for screening for lipoid disorders: Secondary | ICD-10-CM

## 2017-01-28 DIAGNOSIS — L819 Disorder of pigmentation, unspecified: Secondary | ICD-10-CM | POA: Diagnosis not present

## 2017-01-28 DIAGNOSIS — D239 Other benign neoplasm of skin, unspecified: Secondary | ICD-10-CM | POA: Insufficient documentation

## 2017-01-28 DIAGNOSIS — Z136 Encounter for screening for cardiovascular disorders: Secondary | ICD-10-CM | POA: Diagnosis not present

## 2017-01-28 DIAGNOSIS — Z0000 Encounter for general adult medical examination without abnormal findings: Secondary | ICD-10-CM

## 2017-01-28 DIAGNOSIS — M549 Dorsalgia, unspecified: Secondary | ICD-10-CM | POA: Insufficient documentation

## 2017-01-28 NOTE — Patient Instructions (Addendum)
Please sign medical records to get PAP smear and immunization records from Dr. Donnel Saxon with [facility].  You will be contacted with appt to dermatology.  Return to lab fasting at least 6-8hrs prior to blood draw.  Health Maintenance, Female Adopting a healthy lifestyle and getting preventive care can go a long way to promote health and wellness. Talk with your health care provider about what schedule of regular examinations is right for you. This is a good chance for you to check in with your provider about disease prevention and staying healthy. In between checkups, there are plenty of things you can do on your own. Experts have done a lot of research about which lifestyle changes and preventive measures are most likely to keep you healthy. Ask your health care provider for more information. Weight and diet Eat a healthy diet  Be sure to include plenty of vegetables, fruits, low-fat dairy products, and lean protein.  Do not eat a lot of foods high in solid fats, added sugars, or salt.  Get regular exercise. This is one of the most important things you can do for your health.  Most adults should exercise for at least 150 minutes each week. The exercise should increase your heart rate and make you sweat (moderate-intensity exercise).  Most adults should also do strengthening exercises at least twice a week. This is in addition to the moderate-intensity exercise. Maintain a healthy weight  Body mass index (BMI) is a measurement that can be used to identify possible weight problems. It estimates body fat based on height and weight. Your health care provider can help determine your BMI and help you achieve or maintain a healthy weight.  For females 28 years of age and older:  A BMI below 18.5 is considered underweight.  A BMI of 18.5 to 24.9 is normal.  A BMI of 25 to 29.9 is considered overweight.  A BMI of 30 and above is considered obese. Watch levels of cholesterol  and blood lipids  You should start having your blood tested for lipids and cholesterol at 37 years of age, then have this test every 5 years.  You may need to have your cholesterol levels checked more often if:  Your lipid or cholesterol levels are high.  You are older than 37 years of age.  You are at high risk for heart disease. Cancer screening Lung Cancer  Lung cancer screening is recommended for adults 34-51 years old who are at high risk for lung cancer because of a history of smoking.  A yearly low-dose CT scan of the lungs is recommended for people who:  Currently smoke.  Have quit within the past 15 years.  Have at least a 30-pack-year history of smoking. A pack year is smoking an average of one pack of cigarettes a day for 1 year.  Yearly screening should continue until it has been 15 years since you quit.  Yearly screening should stop if you develop a health problem that would prevent you from having lung cancer treatment. Breast Cancer  Practice breast self-awareness. This means understanding how your breasts normally appear and feel.  It also means doing regular breast self-exams. Let your health care provider know about any changes, no matter how small.  If you are in your 20s or 30s, you should have a clinical breast exam (CBE) by a health care provider every 1-3 years as part of a regular health exam.  If you are 68 or older, have a CBE  every year. Also consider having a breast X-ray (mammogram) every year.  If you have a family history of breast cancer, talk to your health care provider about genetic screening.  If you are at high risk for breast cancer, talk to your health care provider about having an MRI and a mammogram every year.  Breast cancer gene (BRCA) assessment is recommended for women who have family members with BRCA-related cancers. BRCA-related cancers include:  Breast.  Ovarian.  Tubal.  Peritoneal cancers.  Results of the assessment  will determine the need for genetic counseling and BRCA1 and BRCA2 testing. Cervical Cancer  Your health care provider may recommend that you be screened regularly for cancer of the pelvic organs (ovaries, uterus, and vagina). This screening involves a pelvic examination, including checking for microscopic changes to the surface of your cervix (Pap test). You may be encouraged to have this screening done every 3 years, beginning at age 36.  For women ages 6-65, health care providers may recommend pelvic exams and Pap testing every 3 years, or they may recommend the Pap and pelvic exam, combined with testing for human papilloma virus (HPV), every 5 years. Some types of HPV increase your risk of cervical cancer. Testing for HPV may also be done on women of any age with unclear Pap test results.  Other health care providers may not recommend any screening for nonpregnant women who are considered low risk for pelvic cancer and who do not have symptoms. Ask your health care provider if a screening pelvic exam is right for you.  If you have had past treatment for cervical cancer or a condition that could lead to cancer, you need Pap tests and screening for cancer for at least 20 years after your treatment. If Pap tests have been discontinued, your risk factors (such as having a new sexual partner) need to be reassessed to determine if screening should resume. Some women have medical problems that increase the chance of getting cervical cancer. In these cases, your health care provider may recommend more frequent screening and Pap tests. Colorectal Cancer  This type of cancer can be detected and often prevented.  Routine colorectal cancer screening usually begins at 37 years of age and continues through 37 years of age.  Your health care provider may recommend screening at an earlier age if you have risk factors for colon cancer.  Your health care provider may also recommend using home test kits to check  for hidden blood in the stool.  A small camera at the end of a tube can be used to examine your colon directly (sigmoidoscopy or colonoscopy). This is done to check for the earliest forms of colorectal cancer.  Routine screening usually begins at age 85.  Direct examination of the colon should be repeated every 5-10 years through 37 years of age. However, you may need to be screened more often if early forms of precancerous polyps or small growths are found. Skin Cancer  Check your skin from head to toe regularly.  Tell your health care provider about any new moles or changes in moles, especially if there is a change in a mole's shape or color.  Also tell your health care provider if you have a mole that is larger than the size of a pencil eraser.  Always use sunscreen. Apply sunscreen liberally and repeatedly throughout the day.  Protect yourself by wearing long sleeves, pants, a wide-brimmed hat, and sunglasses whenever you are outside. Heart disease, diabetes, and high blood  pressure  High blood pressure causes heart disease and increases the risk of stroke. High blood pressure is more likely to develop in:  People who have blood pressure in the high end of the normal range (130-139/85-89 mm Hg).  People who are overweight or obese.  People who are African American.  If you are 66-62 years of age, have your blood pressure checked every 3-5 years. If you are 58 years of age or older, have your blood pressure checked every year. You should have your blood pressure measured twice-once when you are at a hospital or clinic, and once when you are not at a hospital or clinic. Record the average of the two measurements. To check your blood pressure when you are not at a hospital or clinic, you can use:  An automated blood pressure machine at a pharmacy.  A home blood pressure monitor.  If you are between 20 years and 52 years old, ask your health care provider if you should take aspirin  to prevent strokes.  Have regular diabetes screenings. This involves taking a blood sample to check your fasting blood sugar level.  If you are at a normal weight and have a low risk for diabetes, have this test once every three years after 37 years of age.  If you are overweight and have a high risk for diabetes, consider being tested at a younger age or more often. Preventing infection Hepatitis B  If you have a higher risk for hepatitis B, you should be screened for this virus. You are considered at high risk for hepatitis B if:  You were born in a country where hepatitis B is common. Ask your health care provider which countries are considered high risk.  Your parents were born in a high-risk country, and you have not been immunized against hepatitis B (hepatitis B vaccine).  You have HIV or AIDS.  You use needles to inject street drugs.  You live with someone who has hepatitis B.  You have had sex with someone who has hepatitis B.  You get hemodialysis treatment.  You take certain medicines for conditions, including cancer, organ transplantation, and autoimmune conditions. Hepatitis C  Blood testing is recommended for:  Everyone born from [birth year] through 1966.  Anyone with known risk factors for hepatitis C. Sexually transmitted infections (STIs)  You should be screened for sexually transmitted infections (STIs) including gonorrhea and chlamydia if:  You are sexually active and are younger than 37 years of age.  You are older than 37 years of age and your health care provider tells you that you are at risk for this type of infection.  Your sexual activity has changed since you were last screened and you are at an increased risk for chlamydia or gonorrhea. Ask your health care provider if you are at risk.  If you do not have HIV, but are at risk, it may be recommended that you take a prescription medicine daily to prevent HIV infection. This is called pre-exposure  prophylaxis (PrEP). You are considered at risk if:  You are sexually active and do not regularly use condoms or know the HIV status of your partner(s).  You take drugs by injection.  You are sexually active with a partner who has HIV. Talk with your health care provider about whether you are at high risk of being infected with HIV. If you choose to begin PrEP, you should first be tested for HIV. You should then be tested every 3 months for  as long as you are taking PrEP. Pregnancy  If you are premenopausal and you may become pregnant, ask your health care provider about preconception counseling.  If you may become pregnant, take 400 to 800 micrograms (mcg) of folic acid every day.  If you want to prevent pregnancy, talk to your health care provider about birth control (contraception). Osteoporosis and menopause  Osteoporosis is a disease in which the bones lose minerals and strength with aging. This can result in serious bone fractures. Your risk for osteoporosis can be identified using a bone density scan.  If you are 19 years of age or older, or if you are at risk for osteoporosis and fractures, ask your health care provider if you should be screened.  Ask your health care provider whether you should take a calcium or vitamin D supplement to lower your risk for osteoporosis.  Menopause may have certain physical symptoms and risks.  Hormone replacement therapy may reduce some of these symptoms and risks. Talk to your health care provider about whether hormone replacement therapy is right for you. Follow these instructions at home:  Schedule regular health, dental, and eye exams.  Stay current with your immunizations.  Do not use any tobacco products including cigarettes, chewing tobacco, or electronic cigarettes.  If you are pregnant, do not drink alcohol.  If you are breastfeeding, limit how much and how often you drink alcohol.  Limit alcohol intake to no more than 1 drink  per day for nonpregnant women. One drink equals 12 ounces of beer, 5 ounces of wine, or 1 ounces of hard liquor.  Do not use street drugs.  Do not share needles.  Ask your health care provider for help if you need support or information about quitting drugs.  Tell your health care provider if you often feel depressed.  Tell your health care provider if you have ever been abused or do not feel safe at home. This information is not intended to replace advice given to you by your health care provider. Make sure you discuss any questions you have with your health care provider. Document Released: 03/17/2011 Document Revised: 02/07/2016 Document Reviewed: 06/05/2015 Elsevier Interactive Patient Education  2017 [employer].

## 2017-01-28 NOTE — Assessment & Plan Note (Signed)
>>  ASSESSMENT AND PLAN FOR DEPRESSION WRITTEN ON 01/28/2017 12:17 PM BY Jaydalee Bardwell LUM, NP  Under care of Dr. Evelene Croon (psychiatrist) Stable with lexapro at this time
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
## 2017-01-28 NOTE — Assessment & Plan Note (Signed)
Under care of Dr. Toy Care (psychiatrist) Stable with lexapro at this time

## 2017-02-03 DIAGNOSIS — M5124 Other intervertebral disc displacement, thoracic region: Secondary | ICD-10-CM | POA: Diagnosis not present

## 2017-02-03 DIAGNOSIS — M5136 Other intervertebral disc degeneration, lumbar region: Secondary | ICD-10-CM | POA: Diagnosis not present

## 2017-02-03 DIAGNOSIS — M549 Dorsalgia, unspecified: Secondary | ICD-10-CM | POA: Diagnosis not present

## 2017-02-03 DIAGNOSIS — M5134 Other intervertebral disc degeneration, thoracic region: Secondary | ICD-10-CM | POA: Diagnosis not present

## 2017-02-05 ENCOUNTER — Telehealth: Payer: Self-pay | Admitting: Internal Medicine

## 2017-02-05 NOTE — Telephone Encounter (Signed)
ROI fax to [facility]

## 2017-02-10 ENCOUNTER — Ambulatory Visit: Payer: Self-pay | Admitting: Family Medicine

## 2017-02-11 ENCOUNTER — Telehealth: Payer: Self-pay | Admitting: Nurse Practitioner

## 2017-02-11 DIAGNOSIS — Z01419 Encounter for gynecological examination (general) (routine) without abnormal findings: Secondary | ICD-10-CM | POA: Diagnosis not present

## 2017-02-11 DIAGNOSIS — Z124 Encounter for screening for malignant neoplasm of cervix: Secondary | ICD-10-CM | POA: Diagnosis not present

## 2017-02-11 DIAGNOSIS — Z6824 Body mass index (BMI) 24.0-24.9, adult: Secondary | ICD-10-CM | POA: Diagnosis not present

## 2017-02-11 NOTE — Telephone Encounter (Signed)
Received 13 pages from [facility] forwarded to Dr. Wilfred Lacy.

## 2017-02-26 ENCOUNTER — Other Ambulatory Visit: Payer: Self-pay

## 2017-02-26 DIAGNOSIS — D492 Neoplasm of unspecified behavior of bone, soft tissue, and skin: Secondary | ICD-10-CM | POA: Diagnosis not present

## 2017-11-04 ENCOUNTER — Ambulatory Visit (INDEPENDENT_AMBULATORY_CARE_PROVIDER_SITE_OTHER): Payer: 59 | Admitting: Nurse Practitioner

## 2017-11-04 ENCOUNTER — Ambulatory Visit: Payer: 59 | Admitting: Nurse Practitioner

## 2017-11-04 ENCOUNTER — Encounter: Payer: Self-pay | Admitting: Nurse Practitioner

## 2017-11-04 VITALS — BP 120/82 | HR 95 | Temp 97.6°F | Ht 67.0 in | Wt 160.0 lb

## 2017-11-04 DIAGNOSIS — D239 Other benign neoplasm of skin, unspecified: Secondary | ICD-10-CM | POA: Diagnosis not present

## 2017-11-04 DIAGNOSIS — J0100 Acute maxillary sinusitis, unspecified: Secondary | ICD-10-CM | POA: Diagnosis not present

## 2017-11-04 DIAGNOSIS — J029 Acute pharyngitis, unspecified: Secondary | ICD-10-CM | POA: Diagnosis not present

## 2017-11-04 DIAGNOSIS — Z13220 Encounter for screening for lipoid disorders: Secondary | ICD-10-CM

## 2017-11-04 DIAGNOSIS — Z136 Encounter for screening for cardiovascular disorders: Secondary | ICD-10-CM

## 2017-11-04 DIAGNOSIS — F339 Major depressive disorder, recurrent, unspecified: Secondary | ICD-10-CM

## 2017-11-04 LAB — POCT RAPID STREP A (OFFICE): RAPID STREP A SCREEN: NEGATIVE

## 2017-11-04 MED ORDER — AMOXICILLIN 875 MG PO TABS: 875.0000 mg | ORAL_TABLET | Freq: Two times a day (BID) | ORAL | 0 refills | Status: DC

## 2017-11-04 MED ORDER — FLUTICASONE PROPIONATE 50 MCG/ACT NA SUSP: 2.0000 | Freq: Every day | NASAL | 0 refills | Status: DC

## 2017-11-04 NOTE — Patient Instructions (Signed)

## 2017-11-04 NOTE — Assessment & Plan Note (Deleted)
chest

## 2017-11-04 NOTE — Progress Notes (Signed)
Subjective:  Patient ID: Valerie Livingston, female    DOB: [birth year]  Age: 37 y.o. MRN: [id number]  CC: Sore Throat (sore throat,coughing green mucus,weak/1 wk/)  Sinusitis  This is a new problem. The current episode started 1 to 4 weeks ago. The problem has been waxing and waning since onset. There has been no fever. Associated symptoms include congestion, coughing, sinus pressure, sneezing, a sore throat and swollen glands. Pertinent negatives include no chills, hoarse voice or shortness of breath. Past treatments include saline sprays. The treatment provided no relief.  will like to be tested for strep.  Will like labs reordered. Did not get blood drawn last year after CPE.  Outpatient Medications Prior to Visit  Medication Sig Dispense Refill  . Boswellia Serrata (BOSWELLIA PO) Take by mouth.    . Cholecalciferol (VITAMIN D PO) Take 4,000 Units by mouth.    . escitalopram (LEXAPRO) 20 MG tablet Take 30 mg by mouth daily.    Marland Kitchen LORazepam (ATIVAN) 0.5 MG tablet Take 0.5 mg by mouth every 8 (eight) hours as needed for anxiety.    . Omega-3 Fatty Acids (FISH OIL) 1200 MG CAPS Take by mouth.    . Prenatal Vit-Fe Fumarate-FA (PRENATAL MULTIVITAMIN) TABS tablet Take 1 tablet by mouth at bedtime.    . TURMERIC PO Take by mouth.     No facility-administered medications prior to visit.     ROS See HPI  Objective:  BP 120/82   Pulse 95   Temp 97.6 F (36.4 C)   Ht 5\' 7"  (1.702 m)   Wt 160 lb (72.6 kg)   SpO2 96%   BMI 25.06 kg/m   BP Readings from Last 3 Encounters:  11/04/17 120/82  01/28/17 98/68  02/11/14 120/77    Wt Readings from Last 3 Encounters:  11/04/17 160 lb (72.6 kg)  01/28/17 153 lb (69.4 kg)  02/10/14 182 lb (82.6 kg)    Physical Exam  Constitutional: She is oriented to person, place, and time.  HENT:  Right Ear: Tympanic membrane, external ear and ear canal normal.  Left Ear: Tympanic membrane, external ear and ear canal normal.  Nose: Mucosal edema  and rhinorrhea present. Right sinus exhibits maxillary sinus tenderness. Right sinus exhibits no frontal sinus tenderness. Left sinus exhibits maxillary sinus tenderness. Left sinus exhibits no frontal sinus tenderness.  Mouth/Throat: Uvula is midline. No trismus in the jaw. Oropharyngeal exudate and posterior oropharyngeal erythema present.  Eyes: No scleral icterus.  Neck: Normal range of motion. Neck supple.  Cardiovascular: Normal rate and normal heart sounds.  Pulmonary/Chest: Effort normal and breath sounds normal.  Musculoskeletal: She exhibits no edema.  Lymphadenopathy:    She has cervical adenopathy.  Neurological: She is alert and oriented to person, place, and time.  Vitals reviewed.   Lab Results  Component Value Date   WBC 13.4 (H) 02/11/2014   HGB 12.5 02/11/2014   HCT 37.1 02/11/2014   PLT 161 02/11/2014   GLUCOSE 92 01/21/2013   CHOL 178 08/09/2012   TRIG 33.0 08/09/2012   HDL 51.90 08/09/2012   LDLCALC 120 (H) 08/09/2012   ALT 16 08/09/2012   AST 19 08/09/2012   NA 136 01/21/2013   K 3.9 01/21/2013   CL 102 01/21/2013   CREATININE 0.8 01/21/2013   BUN 8 01/21/2013   CO2 28 01/21/2013   TSH 1.86 01/21/2013   HGBA1C 5.0 08/09/2012    No results found.  Assessment & Plan:   Valerie Livingston was seen today for sore  throat.  Diagnoses and all orders for this visit:  Sore throat -     POCT rapid strep A  Dysplastic nevus of skin  Recurrent major depressive disorder, remission status unspecified ([place]) -     CBC; Future -     Comprehensive metabolic panel; Future  Encounter for lipid screening for cardiovascular disease -     Lipid panel; Future  Acute non-recurrent maxillary sinusitis -     amoxicillin (AMOXIL) 875 MG tablet; Take 1 tablet (875 mg total) by mouth 2 (two) times daily. -     fluticasone (FLONASE) 50 MCG/ACT nasal spray; Place 2 sprays into both nostrils daily.   I am having Valerie Livingston start on amoxicillin and fluticasone. I am also  having her maintain her prenatal multivitamin, LORazepam, Fish Oil, Cholecalciferol (VITAMIN D PO), TURMERIC PO, Boswellia Serrata (BOSWELLIA PO), and escitalopram.  Meds ordered this encounter  Medications  . amoxicillin (AMOXIL) 875 MG tablet    Sig: Take 1 tablet (875 mg total) by mouth 2 (two) times daily.    Dispense:  20 tablet    Refill:  0    Order Specific Question:   Supervising Provider    Answer:   Lucille Passy [[id number]]  . fluticasone (FLONASE) 50 MCG/ACT nasal spray    Sig: Place 2 sprays into both nostrils daily.    Dispense:  16 g    Refill:  0    Order Specific Question:   Supervising Provider    Answer:   Lucille Passy [[id number]]    Follow-up: Return in about 15 months (around 01/31/2019) for CPE (fasting).  Wilfred Lacy, NP

## 2018-06-22 ENCOUNTER — Ambulatory Visit: Payer: 59 | Admitting: Nurse Practitioner

## 2018-06-22 ENCOUNTER — Ambulatory Visit (INDEPENDENT_AMBULATORY_CARE_PROVIDER_SITE_OTHER): Payer: 59 | Admitting: Nurse Practitioner

## 2018-06-22 ENCOUNTER — Ambulatory Visit (INDEPENDENT_AMBULATORY_CARE_PROVIDER_SITE_OTHER): Payer: 59

## 2018-06-22 ENCOUNTER — Encounter: Payer: Self-pay | Admitting: Nurse Practitioner

## 2018-06-22 VITALS — BP 110/70 | HR 85 | Temp 97.8°F | Ht 67.0 in | Wt 168.0 lb

## 2018-06-22 DIAGNOSIS — S8992XA Unspecified injury of left lower leg, initial encounter: Secondary | ICD-10-CM | POA: Diagnosis not present

## 2018-06-22 DIAGNOSIS — M25562 Pain in left knee: Secondary | ICD-10-CM

## 2018-06-22 DIAGNOSIS — M25462 Effusion, left knee: Secondary | ICD-10-CM | POA: Diagnosis not present

## 2018-06-22 MED ORDER — NAPROXEN 500 MG PO TABS: 500.0000 mg | ORAL_TABLET | Freq: Two times a day (BID) | ORAL | 0 refills | Status: DC | PRN

## 2018-06-22 NOTE — Progress Notes (Signed)
Subjective:  Patient ID: Valerie Livingston, female    DOB: 1981-04-10  Age: 37 y.o. MRN: [id number]  CC: Knee Pain (patient is complaining of left knee pain,swelling, fall 1 wk and a half ago,painful when walk. )  Knee Pain   The incident occurred more than 1 week ago. [place] The injury mechanism was a twisting injury. The pain is present in the left knee. The quality of the pain is described as aching and cramping. The pain has been constant since onset. Associated symptoms include an inability to bear weight. Pertinent negatives include no muscle weakness, numbness or tingling. The symptoms are aggravated by movement, palpation and weight bearing. She has tried ice, immobilization and NSAIDs for the symptoms. The treatment provided moderate relief.   Reviewed past Medical, Social and Family history today.  Outpatient Medications Prior to Visit  Medication Sig Dispense Refill  . escitalopram (LEXAPRO) 20 MG tablet Take 30 mg by mouth daily.    . fluticasone (FLONASE) 50 MCG/ACT nasal spray Place 2 sprays into both nostrils daily. 16 g 0  . LORazepam (ATIVAN) 0.5 MG tablet Take 0.5 mg by mouth every 8 (eight) hours as needed for anxiety.    . Omega-3 Fatty Acids (FISH OIL) 1200 MG CAPS Take by mouth.    . Prenatal Vit-Fe Fumarate-FA (PRENATAL MULTIVITAMIN) TABS tablet Take 1 tablet by mouth at bedtime.    . TURMERIC PO Take by mouth.    Marland Kitchen amoxicillin (AMOXIL) 875 MG tablet Take 1 tablet (875 mg total) by mouth 2 (two) times daily. (Patient not taking: Reported on 06/22/2018) 20 tablet 0  . Boswellia Serrata (BOSWELLIA PO) Take by mouth.    . Cholecalciferol (VITAMIN D PO) Take 4,000 Units by mouth.     No facility-administered medications prior to visit.     ROS See HPI  Objective:  BP 110/70   Pulse 85   Temp 97.8 F (36.6 C) (Oral)   Ht 5\' 7"  (1.702 m)   Wt 168 lb (76.2 kg)   SpO2 98%   BMI 26.31 kg/m   BP Readings from Last 3 Encounters:    06/22/18 110/70  11/04/17 120/82  01/28/17 98/68    Wt Readings from Last 3 Encounters:  06/22/18 168 lb (76.2 kg)  11/04/17 160 lb (72.6 kg)  01/28/17 153 lb (69.4 kg)    Physical Exam  Constitutional: She is oriented to person, place, and time. She appears well-developed and well-nourished.  Musculoskeletal: She exhibits edema and tenderness.       Left knee: She exhibits decreased range of motion and effusion. She exhibits no erythema, no LCL laxity and no MCL laxity. Tenderness found. Medial joint line tenderness noted. No lateral joint line and no patellar tendon tenderness noted.       Left ankle: Normal.       Left lower leg: Normal.       Left foot: Normal.  Limited knee flexion and extension  Neurological: She is alert and oriented to person, place, and time.  Skin: No erythema.  Vitals reviewed.   Lab Results  Component Value Date   WBC 13.4 (H) 02/11/2014   HGB 12.5 02/11/2014   HCT 37.1 02/11/2014   PLT 161 02/11/2014   GLUCOSE 92 01/21/2013   CHOL 178 08/09/2012   TRIG 33.0 08/09/2012   HDL 51.90 08/09/2012   LDLCALC 120 (H) 08/09/2012   ALT 16 08/09/2012   AST 19 08/09/2012   NA 136 01/21/2013  K 3.9 01/21/2013   CL 102 01/21/2013   CREATININE 0.8 01/21/2013   BUN 8 01/21/2013   CO2 28 01/21/2013   TSH 1.86 01/21/2013   HGBA1C 5.0 08/09/2012    Assessment & Plan:   Jossette was seen today for knee pain.  Diagnoses and all orders for this visit:  Acute pain of left knee -     Cancel: DG Knee Complete 4 Views Left -     DG Knee Complete 4 Views Left -     naproxen (NAPROSYN) 500 MG tablet; Take 1 tablet (500 mg total) by mouth 2 (two) times daily as needed (for pain, take with food). -     HYDROcodone-acetaminophen (NORCO/VICODIN) 5-325 MG tablet; Take 1 tablet by mouth every 6 (six) hours as needed for up to 3 days for moderate pain.  Knee injury, left, initial encounter -     Cancel: DG Knee Complete 4 Views Left -     DG Knee Complete 4  Views Left -     naproxen (NAPROSYN) 500 MG tablet; Take 1 tablet (500 mg total) by mouth 2 (two) times daily as needed (for pain, take with food). -     HYDROcodone-acetaminophen (NORCO/VICODIN) 5-325 MG tablet; Take 1 tablet by mouth every 6 (six) hours as needed for up to 3 days for moderate pain.   I am having Vanette Leise start on naproxen and HYDROcodone-acetaminophen. I am also having her maintain her prenatal multivitamin, LORazepam, Fish Oil, Cholecalciferol (VITAMIN D PO), TURMERIC PO, Boswellia Serrata (BOSWELLIA PO), escitalopram, amoxicillin, and fluticasone.  Meds ordered this encounter  Medications  . naproxen (NAPROSYN) 500 MG tablet    Sig: Take 1 tablet (500 mg total) by mouth 2 (two) times daily as needed (for pain, take with food).    Dispense:  30 tablet    Refill:  0    Order Specific Question:   Supervising Provider    Answer:   MATTHEWS, CODY [[id number]]  . HYDROcodone-acetaminophen (NORCO/VICODIN) 5-325 MG tablet    Sig: Take 1 tablet by mouth every 6 (six) hours as needed for up to 3 days for moderate pain.    Dispense:  12 tablet    Refill:  0    Order Specific Question:   Supervising Provider    Answer:   Lucille Passy [[id number]]    Follow-up: Return if symptoms worsen or fail to improve.  Wilfred Lacy, NP

## 2018-06-22 NOTE — Patient Instructions (Addendum)
Wear brace when walking Use crutch on left side. F/up with sports medicine in 1-2weeks.  No fracture and no mis alignment of joint. Small amount of fluid in joint space. F/up with Dr. Raeford Razor as discussed. Continue with use of knee brace and crutch. Hydrocodone sent for pain. Alternate with naproxen.

## 2018-06-23 ENCOUNTER — Encounter: Payer: Self-pay | Admitting: Nurse Practitioner

## 2018-06-23 MED ORDER — HYDROCODONE-ACETAMINOPHEN 5-325 MG PO TABS: 1.0000 | ORAL_TABLET | Freq: Four times a day (QID) | ORAL | 0 refills | Status: AC | PRN

## 2018-06-29 ENCOUNTER — Ambulatory Visit (INDEPENDENT_AMBULATORY_CARE_PROVIDER_SITE_OTHER): Payer: 59

## 2018-06-29 ENCOUNTER — Encounter: Payer: Self-pay | Admitting: Family Medicine

## 2018-06-29 ENCOUNTER — Ambulatory Visit (INDEPENDENT_AMBULATORY_CARE_PROVIDER_SITE_OTHER): Payer: 59 | Admitting: Family Medicine

## 2018-06-29 VITALS — BP 118/66 | HR 74 | Ht 67.0 in | Wt 168.0 lb

## 2018-06-29 DIAGNOSIS — M25562 Pain in left knee: Secondary | ICD-10-CM | POA: Diagnosis not present

## 2018-06-29 DIAGNOSIS — S83242A Other tear of medial meniscus, current injury, left knee, initial encounter: Secondary | ICD-10-CM

## 2018-06-29 NOTE — Progress Notes (Signed)
Valerie Livingston - 37 y.o. female MRN [id number]  Date of birth: March 22, 1981  SUBJECTIVE:  Including CC & ROS.  Chief Complaint  Patient presents with  . Follow-up    Valerie Livingston is a 36 y.o. female that is here today for left knee pain. She was at a wedding reception in [place] and turn her knee.  At that time she had significant pain and inability to walk.  She has had significant swelling of her knees but has gone down with the use of the hinged knee brace.  Still having pain with walking but has been using crutches to help with ambulation.  The pain is minimal if she is not walking.  She does have significant pain if she puts any weight on her left knee.  She denies any history of similar symptoms in her knee.  Has not been taking any medications.  Pain is localized to the knee.  Has limitations with flexion and pain is severe with flexion.   Independent review of the left knee x-ray from 10/8 shows no acute abnormality.  The above documentation has been reviewed and is accurate and complete. Clearance Coots, MD 06/30/2018, 3:33 PM>    Review of Systems  Constitutional: Negative for fever.  HENT: Negative for congestion.   Respiratory: Negative for cough.   Cardiovascular: Negative for chest pain.  Gastrointestinal: Negative for abdominal pain.  Musculoskeletal: Positive for gait problem and joint swelling.  Skin: Negative for color change.  Neurological: Negative for weakness.  Hematological: Negative for adenopathy.  Psychiatric/Behavioral: Negative for agitation.    HISTORY: Past Medical, Surgical, Social, and Family History Reviewed & Updated per EMR.   Pertinent Historical Findings include:  Past Medical History:  Diagnosis Date  . Depression    severe, seasonal sx - follows with Toy Care  . Lumbar herniated disc   . Psoriasis   . Sleep walking and eating   . Thoracic disc herniation   . Vaginal Pap smear, abnormal     Past Surgical History:  Procedure Laterality  Date  . COCCYGECTOMY    . COLPOSCOPY      No Known Allergies  Family History  Problem Relation Age of Onset  . Diabetes Mother   . Asthma Mother   . Depression Mother   . Depression Maternal Uncle      Social History   Socioeconomic History  . Marital status: Married    Spouse name: Not on file  . Number of children: Not on file  . Years of education: Not on file  . Highest education level: Not on file  Occupational History  . Not on file  Social Needs  . Financial resource strain: Not on file  . Food insecurity:    Worry: Not on file    Inability: Not on file  . Transportation needs:    Medical: Not on file    Non-medical: Not on file  Tobacco Use  . Smoking status: Never Smoker  . Smokeless tobacco: Never Used  . Tobacco comment: married, artist  Substance and Sexual Activity  . Alcohol use: No  . Drug use: No  . Sexual activity: Yes    Birth control/protection: Condom  Lifestyle  . Physical activity:    Days per week: Not on file    Minutes per session: Not on file  . Stress: Not on file  Relationships  . Social connections:    Talks on phone: Not on file    Gets together: Not on file  Attends religious service: Not on file    Active member of club or organization: Not on file    Attends meetings of clubs or organizations: Not on file    Relationship status: Not on file  . Intimate partner violence:    Fear of current or ex partner: Not on file    Emotionally abused: Not on file    Physically abused: Not on file    Forced sexual activity: Not on file  Other Topics Concern  . Not on file  Social History Narrative  . Not on file     PHYSICAL EXAM:  VS: BP 118/66   Pulse 74   Ht 5\' 7"  (1.702 m)   Wt 168 lb (76.2 kg)   LMP  (LMP Unknown)   SpO2 98%   BMI 26.31 kg/m  Physical Exam Gen: NAD, alert, cooperative with exam, well-appearing ENT: normal lips, normal nasal mucosa,  Eye: normal EOM, normal conjunctiva and lids CV:  no edema, +2  pedal pulses   Resp: no accessory muscle use, non-labored,  Skin: no rashes, no areas of induration  Neuro: normal tone, normal sensation to touch Psych:  normal insight, alert and oriented MSK:  Left knee:  Mild effusion. No tenderness to palpation over the medial lateral joint line. Normal extension. Limited flexion to about 90 degrees. Positive McMurray's test and Thessaly test. Negative Lockman's test. No instability with stress testing. Walking with a limp. Neurovascularly intact  Limited ultrasound: Left knee:  Mild to moderate effusion in the suprapatellar pouch. Normal-appearing quadricep and patellar tendon. Normal MCL. Normal-appearing medial and lateral joint line. No significant evidence of any medial meniscal tearing  Summary: Knee effusion  Ultrasound and interpretation by Clearance Coots, MD      ASSESSMENT & PLAN:   Acute medial meniscus tear of left knee Based on clinical exam and history would suggest that she has a meniscal tear.  Does not appear to be a ACL tear and the MCL appears to be intact. -MRI to evaluate for meniscal tear. -Counseled on supportive care.

## 2018-06-29 NOTE — Patient Instructions (Signed)
Good to see you  You will get a call about the MRI  I will call you after it is completed and let you know the plan  You can try ice on the knee  Continue using the brace

## 2018-06-30 NOTE — Assessment & Plan Note (Signed)
Based on clinical exam and history would suggest that she has a meniscal tear.  Does not appear to be a ACL tear and the MCL appears to be intact. -MRI to evaluate for meniscal tear. -Counseled on supportive care.

## 2018-07-19 ENCOUNTER — Ambulatory Visit
Admission: RE | Admit: 2018-07-19 | Discharge: 2018-07-19 | Disposition: A | Discharge status: Home / Self Care | Payer: 59 | Source: Ambulatory Visit | Attending: Family Medicine | Admitting: Family Medicine

## 2018-07-19 ENCOUNTER — Telehealth: Payer: Self-pay | Admitting: Family Medicine

## 2018-07-19 DIAGNOSIS — S83242D Other tear of medial meniscus, current injury, left knee, subsequent encounter: Secondary | ICD-10-CM

## 2018-07-19 DIAGNOSIS — M25562 Pain in left knee: Secondary | ICD-10-CM | POA: Diagnosis not present

## 2018-07-19 DIAGNOSIS — S83242A Other tear of medial meniscus, current injury, left knee, initial encounter: Secondary | ICD-10-CM

## 2018-07-19 NOTE — Telephone Encounter (Signed)
Informed of MRI results. She wants to hold off on surgery for now. Will send referral for physical therapy.   Rosemarie Ax, MD [facility] 07/19/2018, 11:20 AM

## 2018-07-22 DIAGNOSIS — S83512A Sprain of anterior cruciate ligament of left knee, initial encounter: Secondary | ICD-10-CM | POA: Diagnosis not present

## 2018-07-22 DIAGNOSIS — S83242D Other tear of medial meniscus, current injury, left knee, subsequent encounter: Secondary | ICD-10-CM | POA: Diagnosis not present

## 2018-08-10 ENCOUNTER — Encounter: Payer: Self-pay | Admitting: Family Medicine

## 2018-08-16 DIAGNOSIS — M2350 Chronic instability of knee, unspecified knee: Secondary | ICD-10-CM | POA: Diagnosis not present

## 2018-08-16 DIAGNOSIS — S83512A Sprain of anterior cruciate ligament of left knee, initial encounter: Secondary | ICD-10-CM | POA: Diagnosis not present

## 2018-08-18 DIAGNOSIS — S83512A Sprain of anterior cruciate ligament of left knee, initial encounter: Secondary | ICD-10-CM | POA: Diagnosis not present

## 2018-08-18 DIAGNOSIS — M2350 Chronic instability of knee, unspecified knee: Secondary | ICD-10-CM | POA: Diagnosis not present

## 2018-08-20 DIAGNOSIS — S83512A Sprain of anterior cruciate ligament of left knee, initial encounter: Secondary | ICD-10-CM | POA: Diagnosis not present

## 2018-08-20 DIAGNOSIS — M2350 Chronic instability of knee, unspecified knee: Secondary | ICD-10-CM | POA: Diagnosis not present

## 2018-08-23 DIAGNOSIS — S83512A Sprain of anterior cruciate ligament of left knee, initial encounter: Secondary | ICD-10-CM | POA: Diagnosis not present

## 2018-08-23 DIAGNOSIS — M2350 Chronic instability of knee, unspecified knee: Secondary | ICD-10-CM | POA: Diagnosis not present

## 2018-08-25 DIAGNOSIS — S83512A Sprain of anterior cruciate ligament of left knee, initial encounter: Secondary | ICD-10-CM | POA: Diagnosis not present

## 2018-08-25 DIAGNOSIS — M2350 Chronic instability of knee, unspecified knee: Secondary | ICD-10-CM | POA: Diagnosis not present

## 2018-08-26 DIAGNOSIS — S83282A Other tear of lateral meniscus, current injury, left knee, initial encounter: Secondary | ICD-10-CM | POA: Diagnosis not present

## 2018-08-26 DIAGNOSIS — S83512A Sprain of anterior cruciate ligament of left knee, initial encounter: Secondary | ICD-10-CM | POA: Diagnosis not present

## 2018-08-27 ENCOUNTER — Encounter: Payer: Self-pay | Admitting: Family Medicine

## 2018-08-27 DIAGNOSIS — S83512A Sprain of anterior cruciate ligament of left knee, initial encounter: Secondary | ICD-10-CM | POA: Diagnosis not present

## 2018-08-27 DIAGNOSIS — M2350 Chronic instability of knee, unspecified knee: Secondary | ICD-10-CM | POA: Diagnosis not present

## 2018-10-13 DIAGNOSIS — S83512A Sprain of anterior cruciate ligament of left knee, initial encounter: Secondary | ICD-10-CM | POA: Diagnosis not present

## 2018-10-13 DIAGNOSIS — M2350 Chronic instability of knee, unspecified knee: Secondary | ICD-10-CM | POA: Diagnosis not present

## 2018-10-20 DIAGNOSIS — S83512A Sprain of anterior cruciate ligament of left knee, initial encounter: Secondary | ICD-10-CM | POA: Diagnosis not present

## 2018-10-20 DIAGNOSIS — M2350 Chronic instability of knee, unspecified knee: Secondary | ICD-10-CM | POA: Diagnosis not present

## 2018-10-27 DIAGNOSIS — S83512A Sprain of anterior cruciate ligament of left knee, initial encounter: Secondary | ICD-10-CM | POA: Diagnosis not present

## 2018-10-27 DIAGNOSIS — M2350 Chronic instability of knee, unspecified knee: Secondary | ICD-10-CM | POA: Diagnosis not present

## 2018-11-03 DIAGNOSIS — S83512A Sprain of anterior cruciate ligament of left knee, initial encounter: Secondary | ICD-10-CM | POA: Diagnosis not present

## 2018-11-03 DIAGNOSIS — M2350 Chronic instability of knee, unspecified knee: Secondary | ICD-10-CM | POA: Diagnosis not present

## 2019-02-25 ENCOUNTER — Encounter: Payer: Self-pay | Admitting: Nurse Practitioner

## 2019-03-07 ENCOUNTER — Telehealth: Payer: Self-pay | Admitting: Nurse Practitioner

## 2019-03-07 NOTE — Telephone Encounter (Signed)
Questions for Screening COVID-19  Symptom onset: n/a  Travel or Contacts: no  During this illness, did/does the patient experience any of the following symptoms? Fever >100.21F []   Yes [x]   No []   Unknown Subjective fever (felt feverish) []   Yes [x]   No []   Unknown Chills []   Yes [x]   No []   Unknown Muscle aches (myalgia) []   Yes [x]   No []   Unknown Runny nose (rhinorrhea) []   Yes [x]   No []   Unknown Sore throat []   Yes [x]   No []   Unknown Cough (new onset or worsening of chronic cough) []   Yes [x]   No []   Unknown Shortness of breath (dyspnea) []   Yes [x]   No []   Unknown Nausea or vomiting []   Yes [x]   No []   Unknown Headache []   Yes [x]   No []   Unknown Abdominal pain  []   Yes [x]   No []   Unknown Diarrhea (?3 loose/looser than normal stools/24hr period) []   Yes [x]   No []   Unknown Other, specify:  Patient risk factors: Smoker? []   Current []   Former []   Never If female, currently pregnant? []   Yes []   No  Patient Active Problem List   Diagnosis Date Noted  . Acute medial meniscus tear of left knee 06/29/2018  . Back pain 01/28/2017  . Dysplastic nevus of skin 01/28/2017  . Vaginal delivery 02/10/2014  . Positive GBS test 02/09/2014  . H/O cone biopsy of cervix 2011 02/09/2014  . Inguinal hernia--mild, left side. 06/08/2013  . Psoriasis   . Severe bipolar I disorder, current or most recent episode depressed ([place])   . Depression 08/09/2012    Plan:  []   High risk for COVID-19 with red flags go to ED (with CP, SOB, weak/lightheaded, or fever > 101.5). Call ahead.  []   High risk for COVID-19 but stable. Inform provider and coordinate time for [facility] visit.   []   No red flags but URI signs or symptoms okay for [facility] visit.

## 2019-03-08 ENCOUNTER — Encounter: Payer: Self-pay | Admitting: Nurse Practitioner

## 2019-03-08 ENCOUNTER — Ambulatory Visit (INDEPENDENT_AMBULATORY_CARE_PROVIDER_SITE_OTHER): Payer: 59 | Admitting: Nurse Practitioner

## 2019-03-08 ENCOUNTER — Other Ambulatory Visit: Payer: Self-pay

## 2019-03-08 ENCOUNTER — Other Ambulatory Visit (HOSPITAL_COMMUNITY)
Admission: RE | Admit: 2019-03-08 | Discharge: 2019-03-08 | Disposition: A | Discharge status: Home / Self Care | Payer: No Typology Code available for payment source | Source: Ambulatory Visit | Attending: Nurse Practitioner | Admitting: Nurse Practitioner

## 2019-03-08 VITALS — BP 142/82 | HR 95 | Temp 98.5°F | Ht 67.0 in | Wt 158.0 lb

## 2019-03-08 DIAGNOSIS — Z13220 Encounter for screening for lipoid disorders: Secondary | ICD-10-CM

## 2019-03-08 DIAGNOSIS — Z0001 Encounter for general adult medical examination with abnormal findings: Secondary | ICD-10-CM

## 2019-03-08 DIAGNOSIS — Z124 Encounter for screening for malignant neoplasm of cervix: Secondary | ICD-10-CM | POA: Diagnosis not present

## 2019-03-08 DIAGNOSIS — Z113 Encounter for screening for infections with a predominantly sexual mode of transmission: Secondary | ICD-10-CM | POA: Diagnosis not present

## 2019-03-08 DIAGNOSIS — F339 Major depressive disorder, recurrent, unspecified: Secondary | ICD-10-CM

## 2019-03-08 DIAGNOSIS — Z23 Encounter for immunization: Secondary | ICD-10-CM

## 2019-03-08 DIAGNOSIS — F314 Bipolar disorder, current episode depressed, severe, without psychotic features: Secondary | ICD-10-CM

## 2019-03-08 DIAGNOSIS — R739 Hyperglycemia, unspecified: Secondary | ICD-10-CM

## 2019-03-08 DIAGNOSIS — Z136 Encounter for screening for cardiovascular disorders: Secondary | ICD-10-CM

## 2019-03-08 NOTE — Assessment & Plan Note (Addendum)
No prescribed medication use at this time. Has not seen Dr. Toy Care in several months. Admits to using wild mushrooms (lion mane and [place] tail) to manage her mood. She denies diagnosis of bipolar disorder

## 2019-03-08 NOTE — Patient Instructions (Addendum)
Go to lab for blood draw. Needs to monitor BP at home, call office if persistently >140/80.  Health Maintenance, Female Adopting a healthy lifestyle and getting preventive care can go a long way to promote health and wellness. Talk with your health care provider about what schedule of regular examinations is right for you. This is a good chance for you to check in with your provider about disease prevention and staying healthy. In between checkups, there are plenty of things you can do on your own. Experts have done a lot of research about which lifestyle changes and preventive measures are most likely to keep you healthy. Ask your health care provider for more information. Weight and diet Eat a healthy diet  Be sure to include plenty of vegetables, fruits, low-fat dairy products, and lean protein.  Do not eat a lot of foods high in solid fats, added sugars, or salt.  Get regular exercise. This is one of the most important things you can do for your health. ? Most adults should exercise for at least 150 minutes each week. The exercise should increase your heart rate and make you sweat (moderate-intensity exercise). ? Most adults should also do strengthening exercises at least twice a week. This is in addition to the moderate-intensity exercise. Maintain a healthy weight  Body mass index (BMI) is a measurement that can be used to identify possible weight problems. It estimates body fat based on height and weight. Your health care provider can help determine your BMI and help you achieve or maintain a healthy weight.  For females 77 years of age and older: ? A BMI below 18.5 is considered underweight. ? A BMI of 18.5 to 24.9 is normal. ? A BMI of 25 to 29.9 is considered overweight. ? A BMI of 30 and above is considered obese. Watch levels of cholesterol and blood lipids  You should start having your blood tested for lipids and cholesterol at 39 years of age, then have this test every 5  years.  You may need to have your cholesterol levels checked more often if: ? Your lipid or cholesterol levels are high. ? You are older than 39 years of age. ? You are at high risk for heart disease. Cancer screening Lung Cancer  Lung cancer screening is recommended for adults 66-66 years old who are at high risk for lung cancer because of a history of smoking.  A yearly low-dose CT scan of the lungs is recommended for people who: ? Currently smoke. ? Have quit within the past 15 years. ? Have at least a 30-pack-year history of smoking. A pack year is smoking an average of one pack of cigarettes a day for 1 year.  Yearly screening should continue until it has been 15 years since you quit.  Yearly screening should stop if you develop a health problem that would prevent you from having lung cancer treatment. Breast Cancer  Practice breast self-awareness. This means understanding how your breasts normally appear and feel.  It also means doing regular breast self-exams. Let your health care provider know about any changes, no matter how small.  If you are in your 20s or 30s, you should have a clinical breast exam (CBE) by a health care provider every 1-3 years as part of a regular health exam.  If you are 22 or older, have a CBE every year. Also consider having a breast X-ray (mammogram) every year.  If you have a family history of breast cancer, talk to  your health care provider about genetic screening.  If you are at high risk for breast cancer, talk to your health care provider about having an MRI and a mammogram every year.  Breast cancer gene (BRCA) assessment is recommended for women who have family members with BRCA-related cancers. BRCA-related cancers include: ? Breast. ? Ovarian. ? Tubal. ? Peritoneal cancers.  Results of the assessment will determine the need for genetic counseling and BRCA1 and BRCA2 testing. Cervical Cancer Your health care provider may recommend  that you be screened regularly for cancer of the pelvic organs (ovaries, uterus, and vagina). This screening involves a pelvic examination, including checking for microscopic changes to the surface of your cervix (Pap test). You may be encouraged to have this screening done every 3 years, beginning at age 56.  For women ages 54-65, health care providers may recommend pelvic exams and Pap testing every 3 years, or they may recommend the Pap and pelvic exam, combined with testing for human papilloma virus (HPV), every 5 years. Some types of HPV increase your risk of cervical cancer. Testing for HPV may also be done on women of any age with unclear Pap test results.  Other health care providers may not recommend any screening for nonpregnant women who are considered low risk for pelvic cancer and who do not have symptoms. Ask your health care provider if a screening pelvic exam is right for you.  If you have had past treatment for cervical cancer or a condition that could lead to cancer, you need Pap tests and screening for cancer for at least 20 years after your treatment. If Pap tests have been discontinued, your risk factors (such as having a new sexual partner) need to be reassessed to determine if screening should resume. Some women have medical problems that increase the chance of getting cervical cancer. In these cases, your health care provider may recommend more frequent screening and Pap tests. Colorectal Cancer  This type of cancer can be detected and often prevented.  Routine colorectal cancer screening usually begins at 39 years of age and continues through 39 years of age.  Your health care provider may recommend screening at an earlier age if you have risk factors for colon cancer.  Your health care provider may also recommend using home test kits to check for hidden blood in the stool.  A small camera at the end of a tube can be used to examine your colon directly (sigmoidoscopy or  colonoscopy). This is done to check for the earliest forms of colorectal cancer.  Routine screening usually begins at age 56.  Direct examination of the colon should be repeated every 5-10 years through 39 years of age. However, you may need to be screened more often if early forms of precancerous polyps or small growths are found. Skin Cancer  Check your skin from head to toe regularly.  Tell your health care provider about any new moles or changes in moles, especially if there is a change in a mole's shape or color.  Also tell your health care provider if you have a mole that is larger than the size of a pencil eraser.  Always use sunscreen. Apply sunscreen liberally and repeatedly throughout the day.  Protect yourself by wearing long sleeves, pants, a wide-brimmed hat, and sunglasses whenever you are outside. Heart disease, diabetes, and high blood pressure  High blood pressure causes heart disease and increases the risk of stroke. High blood pressure is more likely to develop in: ?  People who have blood pressure in the high end of the normal range (130-139/85-89 mm Hg). ? People who are overweight or obese. ? People who are African American.  If you are 28-73 years of age, have your blood pressure checked every 3-5 years. If you are 4 years of age or older, have your blood pressure checked every year. You should have your blood pressure measured twice-once when you are at a hospital or clinic, and once when you are not at a hospital or clinic. Record the average of the two measurements. To check your blood pressure when you are not at a hospital or clinic, you can use: ? An automated blood pressure machine at a pharmacy. ? A home blood pressure monitor.  If you are between 68 years and 69 years old, ask your health care provider if you should take aspirin to prevent strokes.  Have regular diabetes screenings. This involves taking a blood sample to check your fasting blood sugar  level. ? If you are at a normal weight and have a low risk for diabetes, have this test once every three years after 39 years of age. ? If you are overweight and have a high risk for diabetes, consider being tested at a younger age or more often. Preventing infection Hepatitis B  If you have a higher risk for hepatitis B, you should be screened for this virus. You are considered at high risk for hepatitis B if: ? You were born in a country where hepatitis B is common. Ask your health care provider which countries are considered high risk. ? Your parents were born in a high-risk country, and you have not been immunized against hepatitis B (hepatitis B vaccine). ? You have HIV or AIDS. ? You use needles to inject street drugs. ? You live with someone who has hepatitis B. ? You have had sex with someone who has hepatitis B. ? You get hemodialysis treatment. ? You take certain medicines for conditions, including cancer, organ transplantation, and autoimmune conditions. Hepatitis C  Blood testing is recommended for: ? Everyone born from [birth year] through 1966. ? Anyone with known risk factors for hepatitis C. Sexually transmitted infections (STIs)  You should be screened for sexually transmitted infections (STIs) including gonorrhea and chlamydia if: ? You are sexually active and are younger than 39 years of age. ? You are older than 39 years of age and your health care provider tells you that you are at risk for this type of infection. ? Your sexual activity has changed since you were last screened and you are at an increased risk for chlamydia or gonorrhea. Ask your health care provider if you are at risk.  If you do not have HIV, but are at risk, it may be recommended that you take a prescription medicine daily to prevent HIV infection. This is called pre-exposure prophylaxis (PrEP). You are considered at risk if: ? You are sexually active and do not regularly use condoms or know the HIV status  of your partner(s). ? You take drugs by injection. ? You are sexually active with a partner who has HIV. Talk with your health care provider about whether you are at high risk of being infected with HIV. If you choose to begin PrEP, you should first be tested for HIV. You should then be tested every 3 months for as long as you are taking PrEP. Pregnancy  If you are premenopausal and you may become pregnant, ask your health care provider about  preconception counseling.  If you may become pregnant, take 400 to 800 micrograms (mcg) of folic acid every day.  If you want to prevent pregnancy, talk to your health care provider about birth control (contraception). Osteoporosis and menopause  Osteoporosis is a disease in which the bones lose minerals and strength with aging. This can result in serious bone fractures. Your risk for osteoporosis can be identified using a bone density scan.  If you are 45 years of age or older, or if you are at risk for osteoporosis and fractures, ask your health care provider if you should be screened.  Ask your health care provider whether you should take a calcium or vitamin D supplement to lower your risk for osteoporosis.  Menopause may have certain physical symptoms and risks.  Hormone replacement therapy may reduce some of these symptoms and risks. Talk to your health care provider about whether hormone replacement therapy is right for you. Follow these instructions at home:  Schedule regular health, dental, and eye exams.  Stay current with your immunizations.  Do not use any tobacco products including cigarettes, chewing tobacco, or electronic cigarettes.  If you are pregnant, do not drink alcohol.  If you are breastfeeding, limit how much and how often you drink alcohol.  Limit alcohol intake to no more than 1 drink per day for nonpregnant women. One drink equals 12 ounces of beer, 5 ounces of wine, or 1 ounces of hard liquor.  Do not use street  drugs.  Do not share needles.  Ask your health care provider for help if you need support or information about quitting drugs.  Tell your health care provider if you often feel depressed.  Tell your health care provider if you have ever been abused or do not feel safe at home. This information is not intended to replace advice given to you by your health care provider. Make sure you discuss any questions you have with your health care provider. Document Released: 03/17/2011 Document Revised: 02/07/2016 Document Reviewed: 06/05/2015 Elsevier Interactive Patient Education  2019 [employer].

## 2019-03-08 NOTE — Progress Notes (Signed)
Subjective:    Patient ID: Valerie Livingston, female    DOB: 1981-05-14, 38 y.o.   MRN: [id number]  Patient presents today for complete physical   HPI  denies any acute complaint.  Mood Disorder: No prescribed medication use at this time. Has not seen Dr. Toy Care in several months. Admits to using wild mushrooms (lion mane and [place] tail) to manage her mood  Sexual History (orientation,birth control, marital status, STD):married, sexually active, does not keep track of menstrual cycle.  Depression/Suicide: Depression screen [facility] 2/9 03/08/2019 01/28/2017  Decreased Interest 0 1  Down, Depressed, Hopeless 0 1  PHQ - 2 Score 0 2  Altered sleeping - 0  Tired, decreased energy - 0  Change in appetite - 0  Feeling bad or failure about yourself  - 0  Trouble concentrating - 0  Moving slowly or fidgety/restless - 0  Suicidal thoughts - 0  PHQ-9 Score - 2   Vision:not needed per patient  Dental:not needed per patient  Immunizations: (TDAP, Hep C screen, Pneumovax, Influenza, zoster)  Health Maintenance  Topic Date Due  . Pap Smear  02/07/2019  . Flu Shot  04/16/2019  . Tetanus Vaccine  03/07/2029  . HIV Screening  Completed   Diet:regular.  Weight:  Wt Readings from Last 3 Encounters:  03/08/19 158 lb (71.7 kg)  06/29/18 168 lb (76.2 kg)  06/22/18 168 lb (76.2 kg)   Exercise:walking, hiking  Fall Risk: Fall Risk  03/08/2019 01/28/2017  Falls in the past year? 1 No  Number falls in past yr: 0 -  Injury with Fall? 1 -  Comment broken bone -   Advanced Directive: Advanced Directives 02/10/2014  Does Patient Have a Medical Advance Directive? Patient does not have advance directive;Patient would not like information  Pre-existing out of facility DNR order (yellow form or pink MOST form) No    Medications and allergies reviewed with patient and updated if appropriate.  Patient Active Problem List   Diagnosis Date Noted  . Acute medial meniscus tear of left knee  06/29/2018  . Back pain 01/28/2017  . Dysplastic nevus of skin 01/28/2017  . Vaginal delivery 02/10/2014  . Positive GBS test 02/09/2014  . H/O cone biopsy of cervix 2011 02/09/2014  . Inguinal hernia--mild, left side. 06/08/2013  . Psoriasis   . Severe bipolar I disorder, current or most recent episode depressed ([place])   . Depression 08/09/2012   Current Outpatient Medications on File Prior to Visit  Medication Sig Dispense Refill  . Cholecalciferol (VITAMIN D PO) Take 4,000 Units by mouth.    . NON FORMULARY 3 different type of mushroom--self prepare at home.    Azucena Freed Serrata (BOSWELLIA PO) Take by mouth.    . escitalopram (LEXAPRO) 20 MG tablet Take 30 mg by mouth daily.    . fluticasone (FLONASE) 50 MCG/ACT nasal spray Place 2 sprays into both nostrils daily. (Patient not taking: Reported on 03/08/2019) 16 g 0  . LORazepam (ATIVAN) 0.5 MG tablet Take 0.5 mg by mouth every 8 (eight) hours as needed for anxiety.    . naproxen (NAPROSYN) 500 MG tablet Take 1 tablet (500 mg total) by mouth 2 (two) times daily as needed (for pain, take with food). (Patient not taking: Reported on 03/08/2019) 30 tablet 0  . Omega-3 Fatty Acids (FISH OIL) 1200 MG CAPS Take by mouth.    . Prenatal Vit-Fe Fumarate-FA (PRENATAL MULTIVITAMIN) TABS tablet Take 1 tablet by mouth at bedtime.    . TURMERIC PO  Take by mouth.     No current facility-administered medications on file prior to visit.     Past Medical History:  Diagnosis Date  . Depression    severe, seasonal sx - follows with [facility]  . Lumbar herniated disc   . Psoriasis   . Sleep walking and eating   . Thoracic disc herniation   . Vaginal Pap smear, abnormal     Past Surgical History:  Procedure Laterality Date  . COCCYGECTOMY    . COLPOSCOPY      Social History   Socioeconomic History  . Marital status: Married    Spouse name: Not on file  . Number of children: Not on file  . Years of education: Not on file  . Highest education  level: Not on file  Occupational History  . Not on file  Social Needs  . Financial resource strain: Not on file  . Food insecurity    Worry: Not on file    Inability: Not on file  . Transportation needs    Medical: Not on file    Non-medical: Not on file  Tobacco Use  . Smoking status: Never Smoker  . Smokeless tobacco: Never Used  . Tobacco comment: married, artist  Substance and Sexual Activity  . Alcohol use: No  . Drug use: No  . Sexual activity: Yes    Birth control/protection: Condom  Lifestyle  . Physical activity    Days per week: Not on file    Minutes per session: Not on file  . Stress: Not on file  Relationships  . Social Herbalist on phone: Not on file    Gets together: Not on file    Attends religious service: Not on file    Active member of club or organization: Not on file    Attends meetings of clubs or organizations: Not on file    Relationship status: Not on file  Other Topics Concern  . Not on file  Social History Narrative  . Not on file    Family History  Problem Relation Age of Onset  . Diabetes Mother   . Asthma Mother   . Depression Mother   . Hepatitis C Mother   . Depression Maternal Uncle        Review of Systems  Constitutional: Negative for fever, malaise/fatigue and weight loss.  HENT: Negative for congestion and sore throat.   Eyes:       Negative for visual changes  Respiratory: Negative for cough and shortness of breath.   Cardiovascular: Negative for chest pain, palpitations and leg swelling.  Gastrointestinal: Negative for blood in stool, constipation, diarrhea and heartburn.  Genitourinary: Negative for dysuria, frequency and urgency.  Musculoskeletal: Negative for falls, joint pain and myalgias.  Skin: Negative for rash.  Neurological: Negative for dizziness, sensory change and headaches.  Endo/Heme/Allergies: Does not bruise/bleed easily.  Psychiatric/Behavioral: Negative for depression, substance abuse and  suicidal ideas. The patient is not nervous/anxious.    Objective:   Vitals:   03/08/19 1348  BP: (!) 142/82  Pulse: 95  Temp: 98.5 F (36.9 C)  SpO2: 97%   Body mass index is 24.75 kg/m.  Physical Examination:  Physical Exam Vitals signs reviewed. Exam conducted with a chaperone present.  HENT:     Head: Normocephalic.     Right Ear: Tympanic membrane, ear canal and external ear normal.     Left Ear: Tympanic membrane, ear canal and external ear normal.  Nose: Nose normal.     Mouth/Throat:     Mouth: Mucous membranes are moist.  Eyes:     Extraocular Movements: Extraocular movements intact.     Conjunctiva/sclera: Conjunctivae normal.     Pupils: Pupils are equal, round, and reactive to light.  Neck:     Musculoskeletal: Normal range of motion and neck supple.  Cardiovascular:     Rate and Rhythm: Normal rate and regular rhythm.     Pulses: Normal pulses.     Heart sounds: Normal heart sounds.  Pulmonary:     Effort: Pulmonary effort is normal.     Breath sounds: Normal breath sounds.  Chest:     Breasts:        Right: Normal.        Left: Normal.  Abdominal:     General: Bowel sounds are normal. There is no distension.     Palpations: Abdomen is soft.     Tenderness: There is no abdominal tenderness. There is no guarding.  Genitourinary:    General: Normal vulva.     Labia:        Right: No rash or tenderness.        Left: No rash or tenderness.      Vagina: Normal.     Cervix: Normal.     Adnexa: Right adnexa normal and left adnexa normal.     Rectum: Normal.  Musculoskeletal: Normal range of motion.     Right lower leg: No edema.     Left lower leg: No edema.  Lymphadenopathy:     Upper Body:     Right upper body: No supraclavicular, axillary or pectoral adenopathy.     Left upper body: No supraclavicular, axillary or pectoral adenopathy.     Lower Body: No right inguinal adenopathy. No left inguinal adenopathy.  Skin:    General: Skin is warm  and dry.     Findings: No erythema or rash.  Neurological:     Mental Status: She is alert and oriented to person, place, and time.  Psychiatric:        Attention and Perception: Attention normal.        Mood and Affect: Mood is elated.        Speech: Speech is rapid and pressured.        Behavior: Behavior is cooperative.        Thought Content: Thought content does not include homicidal or suicidal ideation. Thought content does not include homicidal or suicidal plan.        Cognition and Memory: Cognition normal.        Judgment: Judgment is impulsive.     ASSESSMENT and PLAN:  Eugenia was seen today for annual exam.  Diagnoses and all orders for this visit:  Encounter for preventative adult health care exam with abnormal findings -     CBC with Differential/Platelet -     Comprehensive metabolic panel -     TSH  Screen for STD (sexually transmitted disease) -     Cervicovaginal ancillary only( [facility]) -     HIV antibody (with reflex)  Encounter for Papanicolaou smear for cervical cancer screening -     Cytology - PAP( [place])  Hyperglycemia -     Hemoglobin A1c  Encounter for lipid screening for cardiovascular disease -     Lipid panel  Need for diphtheria-tetanus-pertussis (Tdap) vaccine -     Tdap vaccine greater than or equal to 7yo IM  Recurrent major depressive disorder, remission status unspecified ([place])  Severe bipolar I disorder, current or most recent episode depressed ([place])    Depression No prescribed medication use at this time. Has not seen Dr. Toy Care in several months. Admits to using wild mushrooms (lion mane and [place] tail) to manage her mood. She denies diagnosis of bipolar disorder  Severe bipolar I disorder, current or most recent episode depressed ([place]) No prescribed medication use at this time. Has not seen Dr. Toy Care in several months. Admits to using wild mushrooms (lion mane and [place] tail) to manage her mood. She denies  diagnosis of Bipolar disorder      Problem List Items Addressed This Visit      Other   Depression    No prescribed medication use at this time. Has not seen Dr. Toy Care in several months. Admits to using wild mushrooms (lion mane and [place] tail) to manage her mood. She denies diagnosis of bipolar disorder      Severe bipolar I disorder, current or most recent episode depressed ([place])    No prescribed medication use at this time. Has not seen Dr. Toy Care in several months. Admits to using wild mushrooms (lion mane and [place] tail) to manage her mood. She denies diagnosis of Bipolar disorder       Other Visit Diagnoses    Encounter for preventative adult health care exam with abnormal findings    -  Primary   Relevant Orders   CBC with Differential/Platelet   Comprehensive metabolic panel   TSH   Screen for STD (sexually transmitted disease)       Relevant Orders   Cervicovaginal ancillary only( [place])   HIV antibody (with reflex)   Encounter for Papanicolaou smear for cervical cancer screening       Relevant Orders   Cytology - PAP( [place])   Hyperglycemia       Relevant Orders   Hemoglobin A1c   Encounter for lipid screening for cardiovascular disease       Relevant Orders   Lipid panel   Need for diphtheria-tetanus-pertussis (Tdap) vaccine       Relevant Orders   Tdap vaccine greater than or equal to 7yo IM (Completed)       Follow up: Return if symptoms worsen or fail to improve.  Wilfred Lacy, NP

## 2019-03-08 NOTE — Assessment & Plan Note (Addendum)
>>  ASSESSMENT AND PLAN FOR SEVERE BIPOLAR I DISORDER, CURRENT OR MOST RECENT EPISODE DEPRESSED ([facility]) WRITTEN ON 03/08/2019  5:34 PM BY Thaddeaus Monica LUM, NP  No prescribed medication use at this time. Has not seen Dr. Evelene Croon in several months. Admits to using wild mushrooms (lion mane and [place] tail) to manage her mood. She denies diagnosis of Bipolar disorder  >>ASSESSMENT AND PLAN FOR DEPRESSION WRITTEN ON 03/08/2019  5:34 PM BY Cornel Werber LUM, NP  No prescribed medication use at this time. Has not seen Dr. Evelene Croon in several months. Admits to using wild mushrooms (lion mane and [place] tail) to manage her mood. She denies diagnosis of bipolar disorder

## 2019-03-09 LAB — COMPREHENSIVE METABOLIC PANEL
AG Ratio: 1.5 (calc) (ref 1.0–2.5)
ALT: 11 U/L (ref 6–29)
AST: 17 U/L (ref 10–30)
Albumin: 4.5 g/dL (ref 3.6–5.1)
Alkaline phosphatase (APISO): 57 U/L (ref 31–125)
BUN: 13 mg/dL (ref 7–25)
CO2: 29 mmol/L (ref 20–32)
Calcium: 9.6 mg/dL (ref 8.6–10.2)
Chloride: 102 mmol/L (ref 98–110)
Creat: 0.87 mg/dL (ref 0.50–1.10)
Globulin: 3 g/dL (calc) (ref 1.9–3.7)
Glucose, Bld: 96 mg/dL (ref 65–99)
Potassium: 4 mmol/L (ref 3.5–5.3)
Sodium: 138 mmol/L (ref 135–146)
Total Bilirubin: 0.4 mg/dL (ref 0.2–1.2)
Total Protein: 7.5 g/dL (ref 6.1–8.1)

## 2019-03-09 LAB — CBC WITH DIFFERENTIAL/PLATELET
Absolute Monocytes: 558 cells/uL (ref 200–950)
Basophils Absolute: 41 cells/uL (ref 0–200)
Basophils Relative: 0.5 %
Eosinophils Absolute: 90 cells/uL (ref 15–500)
Eosinophils Relative: 1.1 %
HCT: 37.3 % (ref 35.0–45.0)
Hemoglobin: 12.4 g/dL (ref 11.7–15.5)
Lymphs Abs: 1919 cells/uL (ref 850–3900)
MCH: 29 pg (ref 27.0–33.0)
MCHC: 33.2 g/dL (ref 32.0–36.0)
MCV: 87.4 fL (ref 80.0–100.0)
MPV: 11.4 fL (ref 7.5–12.5)
Monocytes Relative: 6.8 %
Neutro Abs: 5592 cells/uL (ref 1500–7800)
Neutrophils Relative %: 68.2 %
Platelets: 234 10*3/uL (ref 140–400)
RBC: 4.27 10*6/uL (ref 3.80–5.10)
RDW: 12.8 % (ref 11.0–15.0)
Total Lymphocyte: 23.4 %
WBC: 8.2 10*3/uL (ref 3.8–10.8)

## 2019-03-09 LAB — LIPID PANEL
Cholesterol: 179 mg/dL (ref <200)
HDL: 56 mg/dL (ref >=50)
LDL Cholesterol (Calc): 107 mg/dL (calc) — ABNORMAL HIGH
Non-HDL Cholesterol (Calc): 123 mg/dL (calc) (ref <130)
Total CHOL/HDL Ratio: 3.2 (calc) (ref <5.0)
Triglycerides: 71 mg/dL (ref <150)

## 2019-03-09 LAB — HEMOGLOBIN A1C
Hgb A1c MFr Bld: 5 % of total Hgb (ref <5.7)
Mean Plasma Glucose: 97 (calc)
eAG (mmol/L): 5.4 (calc)

## 2019-03-09 LAB — TSH: TSH: 1.67 mIU/L

## 2019-03-09 LAB — HIV ANTIBODY (ROUTINE TESTING W REFLEX): HIV 1&2 Ab, 4th Generation: NONREACTIVE

## 2019-03-10 LAB — CERVICOVAGINAL ANCILLARY ONLY
Bacterial vaginitis: NEGATIVE
Candida vaginitis: NEGATIVE
Chlamydia: NEGATIVE
Neisseria Gonorrhea: NEGATIVE
Trichomonas: NEGATIVE

## 2019-03-10 LAB — CYTOLOGY - PAP
Diagnosis: NEGATIVE
HPV: NOT DETECTED

## 2019-03-11 LAB — CERVICOVAGINAL ANCILLARY ONLY: Herpes: NEGATIVE

## 2019-03-30 ENCOUNTER — Encounter: Payer: Self-pay | Admitting: Nurse Practitioner

## 2019-04-12 ENCOUNTER — Encounter: Payer: Self-pay | Admitting: Nurse Practitioner

## 2019-04-30 ENCOUNTER — Encounter: Payer: Self-pay | Admitting: Family Medicine

## 2019-05-10 ENCOUNTER — Encounter: Payer: Self-pay | Admitting: Family Medicine

## 2019-05-16 ENCOUNTER — Encounter: Payer: Self-pay | Admitting: Family Medicine

## 2019-05-16 ENCOUNTER — Other Ambulatory Visit: Payer: Self-pay

## 2019-05-16 ENCOUNTER — Ambulatory Visit: Payer: Self-pay

## 2019-05-16 ENCOUNTER — Ambulatory Visit (INDEPENDENT_AMBULATORY_CARE_PROVIDER_SITE_OTHER): Payer: 59 | Admitting: Family Medicine

## 2019-05-16 VITALS — BP 110/70 | Ht 67.0 in | Wt 160.0 lb

## 2019-05-16 DIAGNOSIS — M25512 Pain in left shoulder: Secondary | ICD-10-CM | POA: Insufficient documentation

## 2019-05-16 HISTORY — DX: Pain in left shoulder: M25.512

## 2019-05-16 MED ORDER — NITROGLYCERIN 0.2 MG/HR TD PT24: MEDICATED_PATCH | TRANSDERMAL | 11 refills | Status: DC

## 2019-05-16 NOTE — Patient Instructions (Signed)
Good to see you Please try the nitro patches. They can cause headaches but this gets better with time.  Please try the exercises   Please send me a message in MyChart with any questions or updates.  Please see [place] back if you are having any problems..   --Dr. Raeford Razor  Nitroglycerin Protocol   Apply 1/4 nitroglycerin patch to affected area daily.  Change position of patch within the affected area every 24 hours.  You may experience a headache during the first 1-2 weeks of using the patch, these should subside.  If you experience headaches after beginning nitroglycerin patch treatment, you may take your preferred over the counter pain reliever.  Another side effect of the nitroglycerin patch is skin irritation or rash related to patch adhesive.  Please notify our office if you develop more severe headaches or rash, and stop the patch.  Tendon healing with nitroglycerin patch may require 12 to 24 weeks depending on the extent of injury.  Men should not use if taking Viagra, Cialis, or Levitra.   Do not use if you have migraines or rosacea.

## 2019-05-16 NOTE — Assessment & Plan Note (Signed)
Clinical exam would suggest more of an impingement syndrome that is occurring.  Ultrasound was revealing for an effusion of the biceps tendon that would suggest more of a intra-articular process.  Possible for labral change to result and the encircling effusion down the biceps tendon. -Nitro patches. -Counseled on home exercise therapy and supportive care. -If no improvement can consider x-ray or physical therapy.

## 2019-05-16 NOTE — Progress Notes (Signed)
Valerie Livingston - 38 y.o. female MRN [id number]  Date of birth: 1980/10/09  SUBJECTIVE:  Including CC & ROS.  Chief Complaint  Patient presents with  . Shoulder Pain    left shoulder x 3 months    Valerie Livingston is a 38 y.o. female that is presenting with acute on chronic left shoulder pain.  The pain seems to be worse with certain abduction exercises movements.  She is fairly active and does various activities.  The pain is intermittent in nature.  Seems to be worse after she is active and doing different forms of activity such as surfing or climbing a rope.  Pain is occurring over the lateral aspect with some radiation down the arm to the elbow.  Denies any numbness or tingling.  No history of surgery.   Review of Systems  Constitutional: Negative for fever.  HENT: Negative for congestion.   Respiratory: Negative for cough.   Cardiovascular: Negative for chest pain.  Gastrointestinal: Negative for abdominal pain.  Musculoskeletal: Negative for back pain.  Neurological: Negative for weakness.  Hematological: Negative for adenopathy.    HISTORY: Past Medical, Surgical, Social, and Family History Reviewed & Updated per EMR.   Pertinent Historical Findings include:  Past Medical History:  Diagnosis Date  . Depression    severe, seasonal sx - follows with [facility]  . Lumbar herniated disc   . Psoriasis   . Sleep walking and eating   . Thoracic disc herniation   . Vaginal Pap smear, abnormal     Past Surgical History:  Procedure Laterality Date  . COCCYGECTOMY    . COLPOSCOPY      No Known Allergies  Family History  Problem Relation Age of Onset  . Diabetes Mother   . Asthma Mother   . Depression Mother   . Hepatitis C Mother   . Depression Maternal Uncle      Social History   Socioeconomic History  . Marital status: Married    Spouse name: Not on file  . Number of children: Not on file  . Years of education: Not on file  . Highest education level: Not on  file  Occupational History  . Not on file  Social Needs  . Financial resource strain: Not on file  . Food insecurity    Worry: Not on file    Inability: Not on file  . Transportation needs    Medical: Not on file    Non-medical: Not on file  Tobacco Use  . Smoking status: Never Smoker  . Smokeless tobacco: Never Used  . Tobacco comment: married, artist  Substance and Sexual Activity  . Alcohol use: No  . Drug use: No  . Sexual activity: Yes    Birth control/protection: Condom  Lifestyle  . Physical activity    Days per week: Not on file    Minutes per session: Not on file  . Stress: Not on file  Relationships  . Social Herbalist on phone: Not on file    Gets together: Not on file    Attends religious service: Not on file    Active member of club or organization: Not on file    Attends meetings of clubs or organizations: Not on file    Relationship status: Not on file  . Intimate partner violence    Fear of current or ex partner: Not on file    Emotionally abused: Not on file    Physically abused: Not on file  Forced sexual activity: Not on file  Other Topics Concern  . Not on file  Social History Narrative  . Not on file     PHYSICAL EXAM:  VS: BP 110/70   Ht 5\' 7"  (1.702 m)   Wt 160 lb (72.6 kg)   BMI 25.06 kg/m  Physical Exam Gen: NAD, alert, cooperative with exam, well-appearing ENT: normal lips, normal nasal mucosa,  Eye: normal EOM, normal conjunctiva and lids CV:  no edema, +2 pedal pulses   Resp: no accessory muscle use, non-labored,  Skin: no rashes, no areas of induration  Neuro: normal tone, normal sensation to touch Psych:  normal insight, alert and oriented MSK:  Left shoulder:  Normal active abduction and flexion  Normal IR and ER  Normal strength to resistance.  Positive empty can and Hawkin's test  Mild pain with Speed's test  NVI   Limited ultrasound: left shoulder:  Mild effusion encircling the biceps tendon  Normal  appearing subscap in static and dynamic testing Normal appearing supraspinatus in status and dynamic testing  Normal appearing posterior Glenohumeral joint   Summary: Biceps tendon effusion to suggest an intra-articular process  Ultrasound and interpretation by Clearance Coots, MD       ASSESSMENT & PLAN:   Acute pain of left shoulder Clinical exam would suggest more of an impingement syndrome that is occurring.  Ultrasound was revealing for an effusion of the biceps tendon that would suggest more of a intra-articular process.  Possible for labral change to result and the encircling effusion down the biceps tendon. -Nitro patches. -Counseled on home exercise therapy and supportive care. -If no improvement can consider x-ray or physical therapy.

## 2019-06-26 IMAGING — MR MR KNEE*L* W/O CM
4 of 7 series · 22 of 40 positions shown · non-contrast
Comparison: Plain films left knee 06/22/2018.

CLINICAL DATA: Left knee pain since a fall 06/13/2018.

EXAM:
MRI OF THE LEFT KNEE WITHOUT CONTRAST
TECHNIQUE: Multiplanar, multisequence MR imaging of the knee was performed. No
intravenous contrast was administered.

[Series 3: T2 fat-sat · axial · 4.0mm · 0.50mm/px · z∈[-48,+56]mm · 6 of 22 slices shown]
[im 1/22]
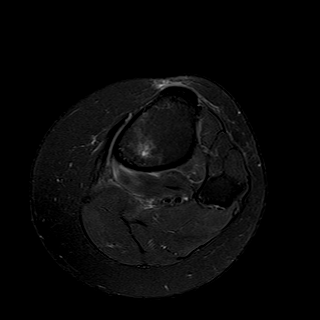
[im 5/22]
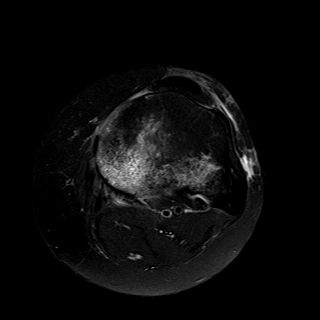
[im 9/22]
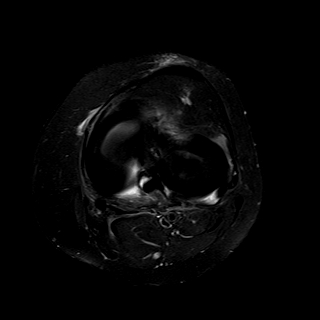
[im 13/22]
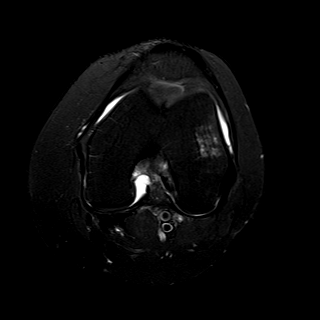
[im 17/22]
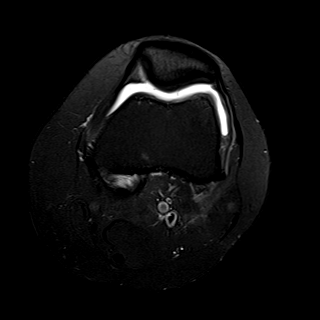
[im 22/22]
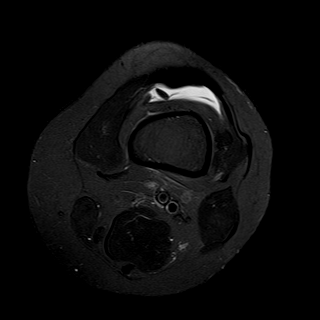

[Series 5: PD fat-sat · sagittal · 3.0mm · 0.29mm/px · 6 of 25 slices shown (1 of 3)]
[im 1/25]
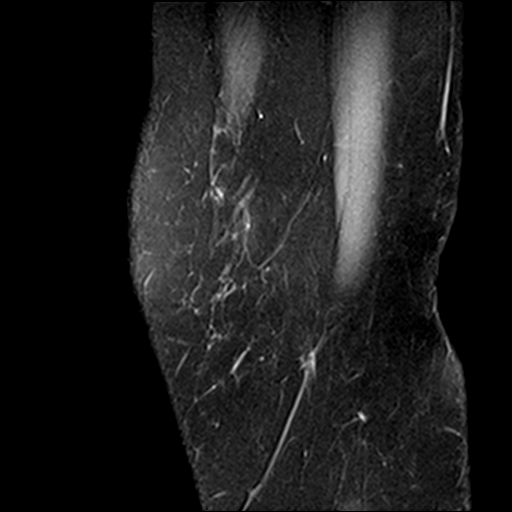
[im 5/25]
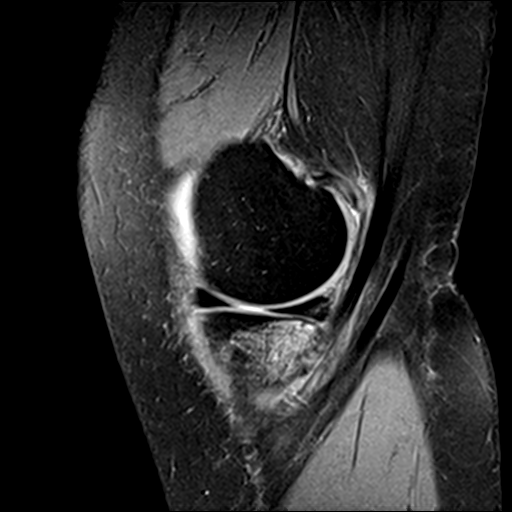
[im 10/25]
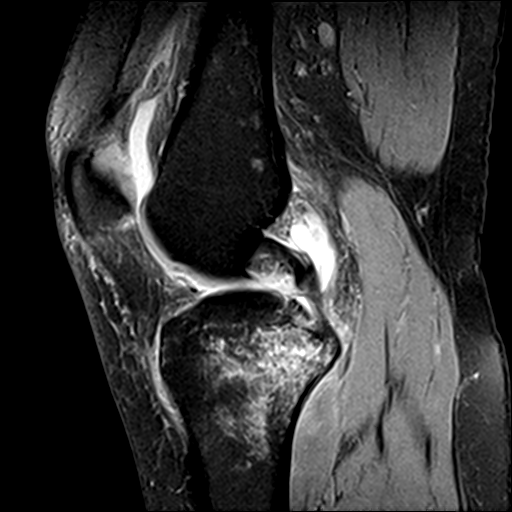
[im 15/25]
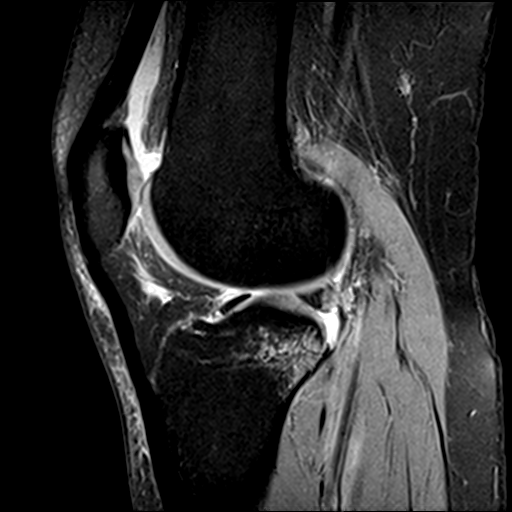
[im 20/25]
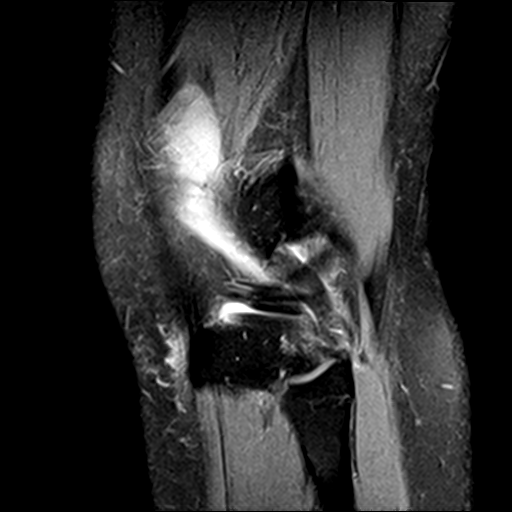
[im 25/25]
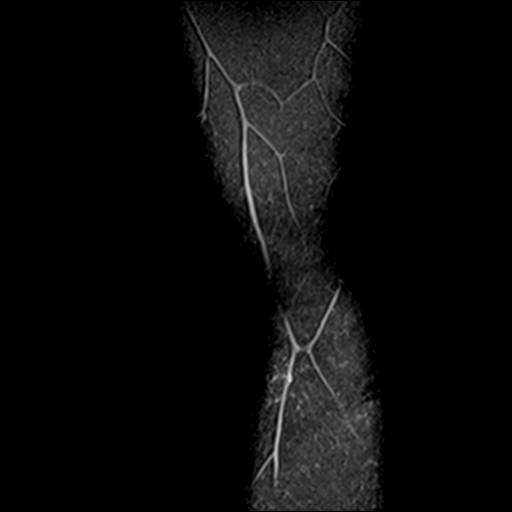

[Series 8: PD fat-sat · coronal · 3.0mm · 0.29mm/px · 7 of 28 slices shown (2 of 3)]
[im 1/28]
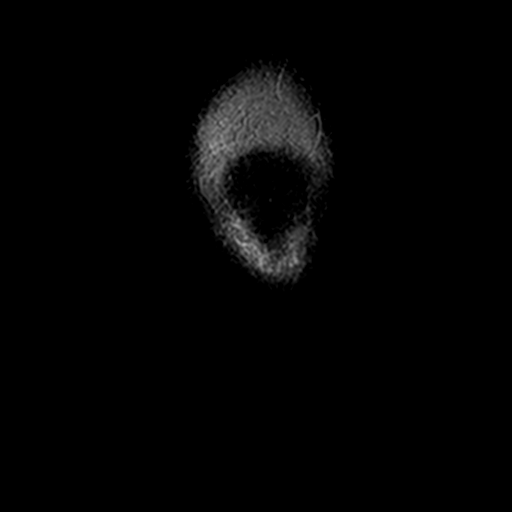
[im 5/28]
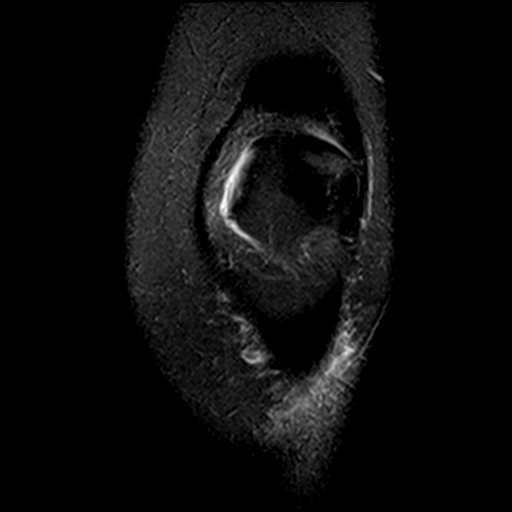
[im 10/28]
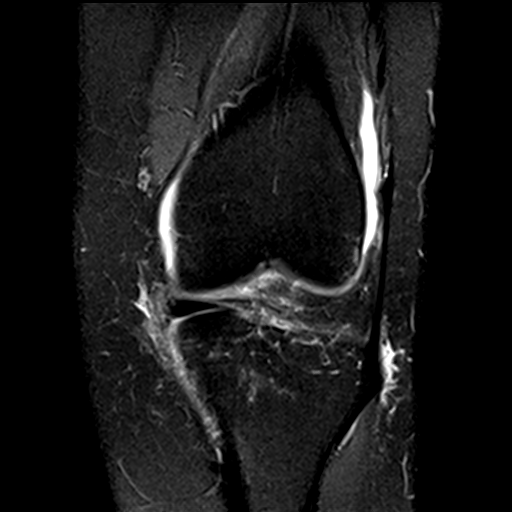
[im 14/28]
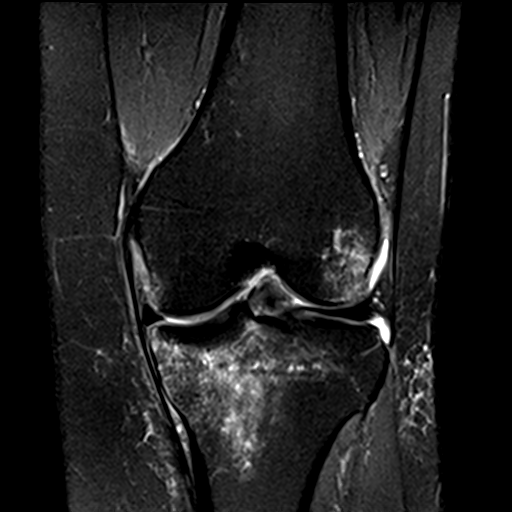
[im 19/28]
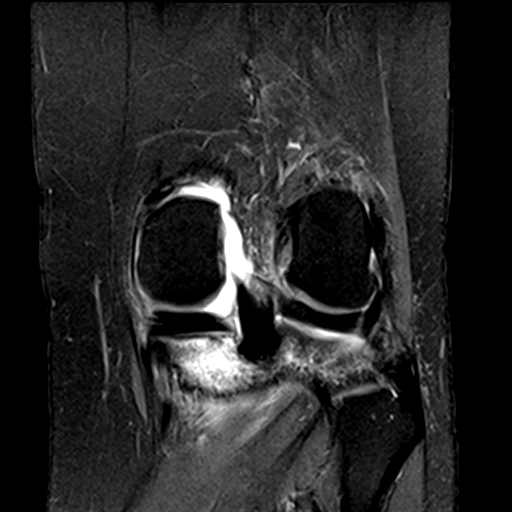
[im 23/28]
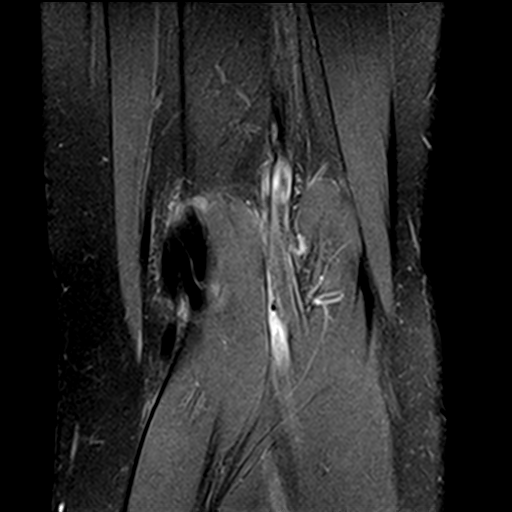
[im 28/28]
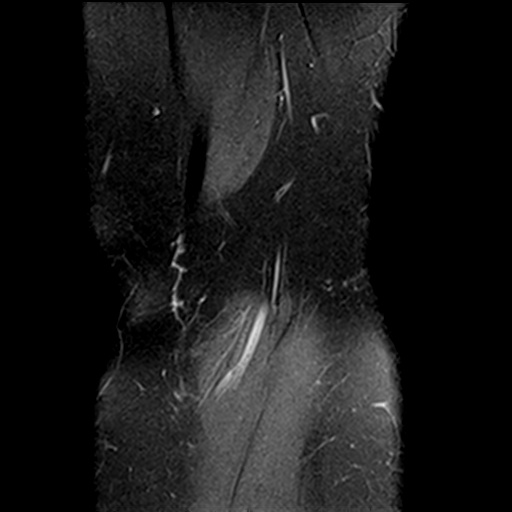

[Series 9: PD fat-sat · coronal · 2.0mm · 0.29mm/px · 3 of 11 slices shown (3 of 3)]
[im 1/11]
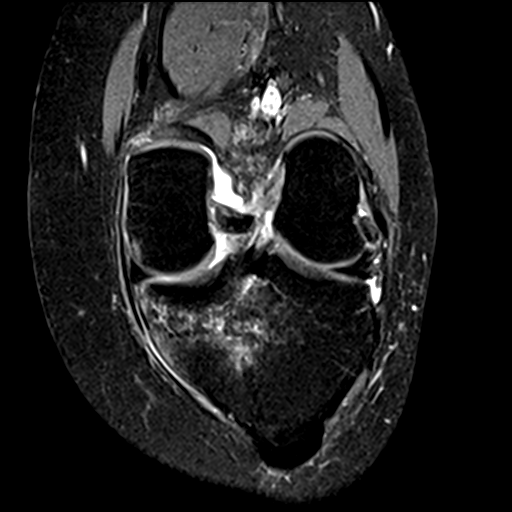
[im 6/11]
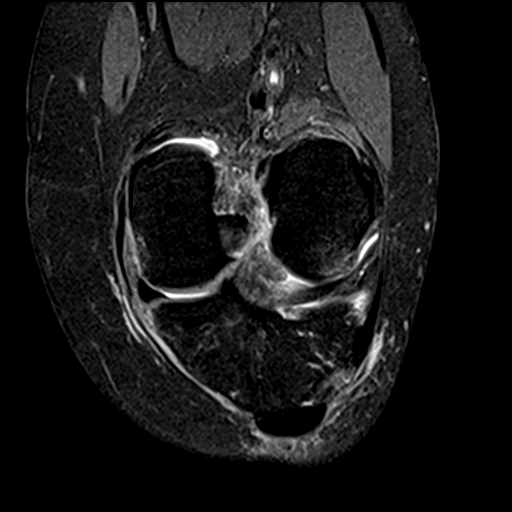
[im 11/11]
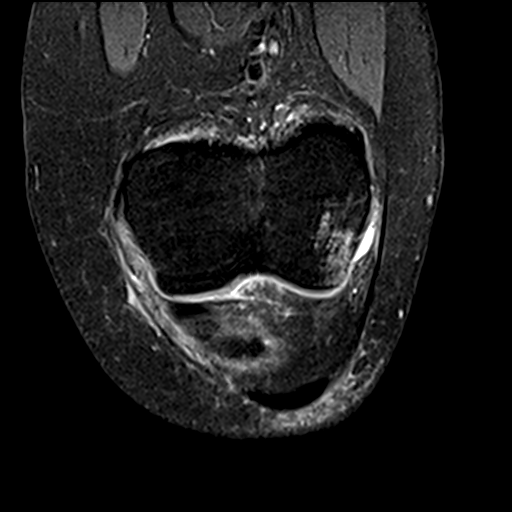

[22 of 40 positions shown; findings below may reference images not displayed]

FINDINGS: MENISCI

Medial meniscus: Intact. Small area of mildly increased T2 signal in
the posterior horn may be due to degeneration or contusion.

Lateral meniscus: There is a small longitudinal tear in the far
periphery of the posterior horn contiguous with the ligament
Wrisberg consistent with a Wrisberg rip.

LIGAMENTS

Cruciates:  The ACL is completely torn.  The PCL is intact.

Collaterals:  Intact.

CARTILAGE

Patellofemoral:  Normal.

Medial:  Normal.

Lateral:  Normal.

Joint:  Small effusion.

Popliteal Fossa:  No Baker's cyst.

Extensor Mechanism:  Intact.

Bones: There are bone contusions about the knee. A small,
nondisplaced fracture of the subchondral bone plate in the posterior
periphery of the medial tibial plateau is noted.

Other: None.
IMPRESSION: Complete ACL tear with associated bone contusions. Small
nondisplaced fracture of the subchondral bone plate of the posterior
periphery of the medial tibial plateau also noted.

Longitudinal tear in the far periphery of the posterior horn of the
lateral meniscus consistent with a Wrisberg rip.

## 2019-12-14 ENCOUNTER — Ambulatory Visit: Payer: 59 | Attending: Internal Medicine

## 2019-12-14 DIAGNOSIS — Z20822 Contact with and (suspected) exposure to covid-19: Secondary | ICD-10-CM

## 2019-12-15 LAB — NOVEL CORONAVIRUS, NAA: SARS-CoV-2, NAA: NOT DETECTED

## 2020-05-31 ENCOUNTER — Encounter: Payer: Self-pay | Admitting: Nurse Practitioner

## 2020-05-31 DIAGNOSIS — T63461A Toxic effect of venom of wasps, accidental (unintentional), initial encounter: Secondary | ICD-10-CM

## 2020-06-04 MED ORDER — EPINEPHRINE 0.3 MG/0.3ML IJ SOAJ: 0.3000 mg | INTRAMUSCULAR | 0 refills | Status: DC | PRN

## 2020-09-22 ENCOUNTER — Other Ambulatory Visit: Payer: Self-pay

## 2020-09-22 DIAGNOSIS — Z20822 Contact with and (suspected) exposure to covid-19: Secondary | ICD-10-CM

## 2020-09-25 LAB — NOVEL CORONAVIRUS, NAA: SARS-CoV-2, NAA: DETECTED — AB

## 2020-09-26 ENCOUNTER — Encounter: Payer: Self-pay | Admitting: Family Medicine

## 2021-01-24 ENCOUNTER — Encounter: Payer: Self-pay | Admitting: Nurse Practitioner

## 2021-01-24 NOTE — Telephone Encounter (Signed)
appt scheduled already for tomorrow. Dm/cma

## 2021-01-25 ENCOUNTER — Ambulatory Visit (INDEPENDENT_AMBULATORY_CARE_PROVIDER_SITE_OTHER): Payer: No Typology Code available for payment source | Admitting: Nurse Practitioner

## 2021-01-25 ENCOUNTER — Encounter: Payer: Self-pay | Admitting: Nurse Practitioner

## 2021-01-25 ENCOUNTER — Other Ambulatory Visit: Payer: Self-pay

## 2021-01-25 VITALS — BP 100/60 | HR 80 | Temp 97.9°F | Ht 67.0 in | Wt 132.2 lb

## 2021-01-25 DIAGNOSIS — R4589 Other symptoms and signs involving emotional state: Secondary | ICD-10-CM

## 2021-01-25 DIAGNOSIS — R634 Abnormal weight loss: Secondary | ICD-10-CM | POA: Diagnosis not present

## 2021-01-25 DIAGNOSIS — F314 Bipolar disorder, current episode depressed, severe, without psychotic features: Secondary | ICD-10-CM | POA: Diagnosis not present

## 2021-01-25 DIAGNOSIS — M791 Myalgia, unspecified site: Secondary | ICD-10-CM | POA: Diagnosis not present

## 2021-01-25 LAB — CBC WITH DIFFERENTIAL/PLATELET
Basophils Absolute: 0 10*3/uL (ref 0.0–0.1)
Basophils Relative: 0.6 % (ref 0.0–3.0)
Eosinophils Absolute: 0 10*3/uL (ref 0.0–0.7)
Eosinophils Relative: 0.7 % (ref 0.0–5.0)
HCT: 37.5 % (ref 36.0–46.0)
Hemoglobin: 12.6 g/dL (ref 12.0–15.0)
Lymphocytes Relative: 24.5 % (ref 12.0–46.0)
Lymphs Abs: 1.3 10*3/uL (ref 0.7–4.0)
MCHC: 33.6 g/dL (ref 30.0–36.0)
MCV: 88 fl (ref 78.0–100.0)
Monocytes Absolute: 0.3 10*3/uL (ref 0.1–1.0)
Monocytes Relative: 6.4 % (ref 3.0–12.0)
Neutro Abs: 3.5 10*3/uL (ref 1.4–7.7)
Neutrophils Relative %: 67.8 % (ref 43.0–77.0)
Platelets: 189 10*3/uL (ref 150.0–400.0)
RBC: 4.27 Mil/uL (ref 3.87–5.11)
RDW: 12.8 % (ref 11.5–15.5)
WBC: 5.2 10*3/uL (ref 4.0–10.5)

## 2021-01-25 LAB — COMPREHENSIVE METABOLIC PANEL
ALT: 8 U/L (ref 0–35)
AST: 13 U/L (ref 0–37)
Albumin: 4.5 g/dL (ref 3.5–5.2)
Alkaline Phosphatase: 36 U/L — ABNORMAL LOW (ref 39–117)
BUN: 7 mg/dL (ref 6–23)
CO2: 30 mEq/L (ref 19–32)
Calcium: 9.3 mg/dL (ref 8.4–10.5)
Chloride: 102 mEq/L (ref 96–112)
Creatinine, Ser: 0.65 mg/dL (ref 0.40–1.20)
GFR: 109.45 mL/min (ref >60.00)
Glucose, Bld: 97 mg/dL (ref 70–99)
Potassium: 4 mEq/L (ref 3.5–5.1)
Sodium: 139 mEq/L (ref 135–145)
Total Bilirubin: 1 mg/dL (ref 0.2–1.2)
Total Protein: 7 g/dL (ref 6.0–8.3)

## 2021-01-25 LAB — TSH: TSH: 0.86 u[IU]/mL (ref 0.35–4.50)

## 2021-01-25 NOTE — Patient Instructions (Addendum)
Go to lab for blood draw  Go to [place], [place] This is for psychiatry evaluation and treatment.  Bipolar 1 Disorder Bipolar 1 disorder is a mental health disorder in which a person has episodes of emotional highs, or mania, and may also have episodes of lows, or depression. Bipolar 1 disorder is different from other bipolar disorders in that it involves extreme episodes of mania (manic episodes). These episodes last at least one week or involve symptoms that are so severe that hospitalization is needed to keep the person safe. What are the causes? The cause of this condition is not known. What increases the risk? The following factors may make you more likely to develop this condition:  Having a family member with the disorder.  Having an imbalance of certain chemicals in the brain (neurotransmitters).  Experiencing stress, such as illness, financial problems, or a death.  Having certain conditions that affect the brain or spinal cord (neurologic conditions).  Having had a brain injury (trauma). What are the signs or symptoms? Symptoms of mania include:  Very high self-esteem or self-confidence.  Decreased need for sleep.  Unusual talkativeness. Speech may be very fast.  Racing thoughts with quick shifts between topics that may or may not be related (flight of ideas).  Ability to concentrate either greatly improved or decreased.  Increased purposeful activity, such as work, studies, or social activity.  Increased agitation. This could be pacing, squirming, fidgeting, or finger and toe tapping.  Impulsive behavior and poor judgment. This may result in high-risk activities that are sexual, financial, or physical. Symptoms of depression include:  Extreme degrees of sadness, uncontrollable crying, hopelessness, worthlessness, or numbness.  Sleep problems, such as insomnia, waking early, or sleeping too much.  No longer enjoying things you used to  enjoy.  Isolation. You may often spend time alone.  Lack of energy or motivation, and moving more slowly than normal.  Trouble making decisions.  Increased appetite or loss of appetite.  Thoughts of death, or wanting to harm yourself. Sometimes, you may have a mixed mood. This means having symptoms of mania and depression at the same time. Stress can often trigger these symptoms. How is this diagnosed? This condition may be diagnosed based on:  Emotional episodes.  Medical history.  Use of alcohol, drugs, and prescription medicines. Certain medical conditions and substances can cause symptoms that seem like bipolar disorder. This is called secondary bipolar disorder. Your health care provider may ask you to take a short test. This helps to understand your symptoms. You may also be asked to see a mental health specialist to follow up on this diagnosis and start treatment. How is this treated? This condition is a long-term (chronic) illness. It is often managed with ongoing treatment rather than treatment only when symptoms occur. A combination of treatments is the main approach. Treatment may include:  Medicine. Medicine can be prescribed by a health care provider who specializes in treating mental health disorders (psychiatrist). Medicines called mood stabilizers are usually prescribed. If symptoms occur during treatment with a mood stabilizer, other medicines may be added.  Psychotherapy. Some forms of talk therapy, such as cognitive behavioral therapy (CBT) and family therapy, can help with learning to manage bipolar disorder.  Psychoeducation. This helps you and others understand how this disorder is managed. Include friends and family in educational sessions so they learn how best to support you.  Methods of managing your condition, such as journaling or relaxation exercises. Relaxation exercises include: ?  Yoga. ? Meditation. ? Deep breathing.  Lifestyle changes, such  as: ? Limiting alcohol and drug use. ? Exercising regularly. ? Structuring when you go to bed and get up. ? Eating a healthy diet.  Electroconvulsive therapy (ECT). This is a procedure in which electricity is applied to the brain through the scalp. It may be used in cases of severe bipolar disorder when medicine and psychotherapy work too slowly or do not work.      Follow these instructions at home: Activity  Return to your normal activities as told by your health care provider.  Find activities that you enjoy, and make time to do them.  Exercise regularly as told by your health care provider. Lifestyle  Follow a set schedule for eating and sleeping.  Eat a healthy diet that includes fresh fruits and vegetables, whole grains, low-fat dairy, and lean meat.  Get at least 7-8 hours of sleep each night.  Avoid using products that contain nicotine or tobacco. If you want help quitting, ask your health care provider.  Do not use drugs.   Alcohol use  Do not drink alcohol if: ? Your health care provider tells you not to drink. ? You are pregnant, may be pregnant, or are planning to become pregnant.  If you drink alcohol: ? Limit how much you use to:  0-1 drink a day for women.  0-2 drinks a day for men. ? Be aware of how much alcohol is in your drink. In the [place] one drink equals one 12 oz bottle of beer (355 mL), one 5 oz glass of wine (148 mL), or one 1 oz glass of hard liquor (44 mL). General instructions  Take over-the-counter and prescription medicines only as told by your health care provider. You may think about stopping your medicine, but it is very important to take all your medicine as prescribed.  Consider joining a support group. Your health care provider may be able to recommend one.  Talk with your family and friends about your treatment goals and how they can help.  Keep all follow-up visits as told by your health care provider. This is important. Where to  find more information  [employer] on Mental Illness: www.nami.[place]: https://carter.com/ Contact a health care provider if:  Your symptoms get worse, or your loved ones tell you that your symptoms are getting worse.  You have uncomfortable side effects from your medicine.  You have trouble sleeping.  You have trouble doing daily activities.  You feel unsafe in your surroundings.  You are self-medicating with alcohol or drugs. Get help right away if:  You have new symptoms.  You have thoughts about harming yourself or others.  You are considering suicide. If you ever feel like you may hurt yourself or others, or have thoughts about taking your own life, get help right away. You can go to your nearest emergency department or call:  Your local emergency services (911 in the [place]  A suicide crisis helpline, such as the [facility] at 304-103-9138. This is open 24 hours a day. Summary  Bipolar 1 disorder is a lifelong mental health disorder in which a person has episodes of mania and depression.  This disorder is mainly treated with a combination of medicines, talk and behavioral therapies, and, often, electroconvulsive therapy (ECT).  Include friends and family in educational sessions so they know how best to support you.  Get help right away if you are considering  suicide. This information is not intended to replace advice given to you by your health care provider. Make sure you discuss any questions you have with your health care provider. Document Revised: 02/15/2019 Document Reviewed: 02/15/2019 Elsevier Patient Education  [place].

## 2021-01-25 NOTE — Assessment & Plan Note (Addendum)
Worsening mood in last 64months: sleeping long hours, minimal food or fluid intake, social isolation with family and friends Suicide ideation but no intention to harm self. States she is scared to abandon her husband and children. Hx of suicide attempt 70yrs ago: medication overdose. Current use of lexapro 20mg  daily and ativan 1mg  TID with minimal improvement. Last appt with Dr. Toy Care (psychiatry) 35months ago Previous use of the following medications with no improvement: celexa, zoloft and wellbutrin. Lower dose of wellbutrin was ineffective and high dose led to jittery sensation. allergic reaction to viibryd (rash). She verbalizes interest in inpatient behavioral admission due to fear of harming herself.  Advised to go to [facility] clinic on [address] today. They both agreed.

## 2021-01-25 NOTE — Progress Notes (Signed)
Subjective:  Patient ID: Valerie Livingston, female    DOB: 06-02-1981  Age: 40 y.o. MRN: [id number]  CC: Acute Visit (Pt c/o anxiety and depression and would like to discuss treatment options. Pt states since Dec. 5, 2021 her anxiety and depression has just continued to get worse. )  HPI  Accompanied by her husband today.  Wt Readings from Last 3 Encounters:  01/25/21 132 lb 3.2 oz (60 kg)  05/16/19 160 lb (72.6 kg)  03/08/19 158 lb (71.7 kg)   BP Readings from Last 3 Encounters:  01/25/21 100/60  05/16/19 110/70  03/08/19 (!) 142/82   Severe bipolar I disorder, current or most recent episode depressed ([place]) Worsening mood in last 84months: sleeping long hours, minimal food or fluid intake, social isolation with family and friends Suicide ideation but no intention to harm self. States she is scared to abandon her husband and children. Hx of suicide attempt 27yrs ago: medication overdose. Current use of lexapro 20mg  daily and ativan 1mg  TID with minimal improvement. Last appt with Dr. Toy Care (psychiatry) 74months ago Previous use of the following medications with no improvement: celexa, zoloft and wellbutrin. Lower dose of wellbutrin was ineffective and high dose led to jittery sensation. allergic reaction to viibryd (rash). She verbalizes interest in inpatient behavioral admission due to fear of harming herself.  Advised to go to [facility] clinic on [address] today. They both agreed.  Depression screen [facility] 2/9 01/25/2021 03/08/2019 01/28/2017  Decreased Interest 3 0 1  Down, Depressed, Hopeless 3 0 1  PHQ - 2 Score 6 0 2  Altered sleeping 3 - 0  Tired, decreased energy 3 - 0  Change in appetite 3 - 0  Feeling bad or failure about yourself  3 - 0  Trouble concentrating 2 - 0  Moving slowly or fidgety/restless 0 - 0  Suicidal thoughts 3 - 0  PHQ-9 Score 23 - 2  Difficult doing work/chores Extremely dIfficult - -   GAD 7 : Generalized Anxiety Score 01/25/2021   Nervous, Anxious, on Edge 3  Control/stop worrying 3  Worry too much - different things 3  Trouble relaxing 3  Restless 0  Easily annoyed or irritable 0  Afraid - awful might happen 3  Total GAD 7 Score 15  Anxiety Difficulty Extremely difficult   Reviewed past Medical, Social and Family history today.  Outpatient Medications Prior to Visit  Medication Sig Dispense Refill  . EPINEPHrine (EPIPEN 2-PAK) 0.3 mg/0.3 mL IJ SOAJ injection Inject 0.3 mg into the muscle as needed for anaphylaxis. 1 each 0  . escitalopram (LEXAPRO) 20 MG tablet Take 30 mg by mouth daily.    Marland Kitchen LORazepam (ATIVAN) 0.5 MG tablet Take 0.5 mg by mouth every 8 (eight) hours as needed for anxiety.    Azucena Freed Serrata (BOSWELLIA PO) Take by mouth. (Patient not taking: Reported on 01/25/2021)    . Cholecalciferol (VITAMIN D PO) Take 4,000 Units by mouth. (Patient not taking: Reported on 01/25/2021)    . fluticasone (FLONASE) 50 MCG/ACT nasal spray Place 2 sprays into both nostrils daily. (Patient not taking: No sig reported) 16 g 0  . naproxen (NAPROSYN) 500 MG tablet Take 1 tablet (500 mg total) by mouth 2 (two) times daily as needed (for pain, take with food). (Patient not taking: No sig reported) 30 tablet 0  . nitroGLYCERIN (NITRODUR - DOSED IN MG/24 HR) 0.2 mg/hr patch Cut and apply 1/4 patch to most painful area q24h. (Patient not taking: Reported  on 01/25/2021) 30 patch 11  . NON FORMULARY 3 different type of mushroom--self prepare at home. (Patient not taking: Reported on 01/25/2021)    . Omega-3 Fatty Acids (FISH OIL) 1200 MG CAPS Take by mouth. (Patient not taking: Reported on 01/25/2021)    . Prenatal Vit-Fe Fumarate-FA (PRENATAL MULTIVITAMIN) TABS tablet Take 1 tablet by mouth at bedtime. (Patient not taking: Reported on 01/25/2021)    . TURMERIC PO Take by mouth. (Patient not taking: Reported on 01/25/2021)     No facility-administered medications prior to visit.    ROS See HPI  Objective:  BP 100/60 (BP  Location: Left Arm, Patient Position: Sitting, Cuff Size: Normal)   Pulse 80   Temp 97.9 F (36.6 C) (Temporal)   Ht 5\' 7"  (1.702 m)   Wt 132 lb 3.2 oz (60 kg)   SpO2 97%   BMI 20.71 kg/m   Physical Exam Neurological:     Mental Status: She is alert.  Psychiatric:        Attention and Perception: Attention normal.        Mood and Affect: Affect is labile and tearful.        Speech: Speech normal.        Behavior: Behavior is cooperative.        Thought Content: Thought content includes homicidal and suicidal ideation. Thought content does not include homicidal or suicidal plan.        Cognition and Memory: Cognition normal.    Assessment & Plan:  This visit occurred during the SARS-CoV-2 public health emergency.  Safety protocols were in place, including screening questions prior to the visit, additional usage of staff PPE, and extensive cleaning of exam room while observing appropriate contact time as indicated for disinfecting solutions.   Betul was seen today for acute visit.  Diagnoses and all orders for this visit:  Severe bipolar I disorder, current or most recent episode depressed ([place]) -     Ambulatory referral to Psychiatry  Weight loss, unintentional -     CBC w/Diff -     Comprehensive metabolic panel -     TSH  Myalgia -     CBC w/Diff -     Comprehensive metabolic panel -     TSH -     Vitamin D 1,25 dihydroxy  Suicidal behavior without attempted self-injury -     Ambulatory referral to Psychiatry   Problem List Items Addressed This Visit      Other   Severe bipolar I disorder, current or most recent episode depressed ([employer]) - Primary    Worsening mood in last 67months: sleeping long hours, minimal food or fluid intake, social isolation with family and friends Suicide ideation but no intention to harm self. States she is scared to abandon her husband and children. Hx of suicide attempt 56yrs ago: medication overdose. Current use of lexapro 20mg   daily and ativan 1mg  TID with minimal improvement. Last appt with Dr. Toy Care (psychiatry) 20months ago Previous use of the following medications with no improvement: celexa, zoloft and wellbutrin. Lower dose of wellbutrin was ineffective and high dose led to jittery sensation. allergic reaction to viibryd (rash). She verbalizes interest in inpatient behavioral admission due to fear of harming herself.  Advised to go to [facility] clinic on [address] today. They both agreed.      Relevant Orders   Ambulatory referral to Psychiatry    Other Visit Diagnoses    Weight loss, unintentional  Relevant Orders   CBC w/Diff   Comprehensive metabolic panel   TSH   Myalgia       Relevant Orders   CBC w/Diff   Comprehensive metabolic panel   TSH   Vitamin D 1,25 dihydroxy   Suicidal behavior without attempted self-injury       Relevant Orders   Ambulatory referral to Psychiatry      I have spent 51mins with this patient regarding history taking, documentation, review of Dr. Starleen Arms letter, formulating plan and discussing treatment options with patient.  Follow-up: Return if symptoms worsen or fail to improve.  Wilfred Lacy, NP

## 2021-01-29 LAB — VITAMIN D 1,25 DIHYDROXY
Vitamin D 1, 25 (OH)2 Total: 58 pg/mL (ref 18–72)
Vitamin D2 1, 25 (OH)2: <8 pg/mL
Vitamin D3 1, 25 (OH)2: 58 pg/mL

## 2021-01-30 ENCOUNTER — Ambulatory Visit: Payer: No Typology Code available for payment source | Admitting: Nurse Practitioner

## 2021-03-12 ENCOUNTER — Encounter: Payer: Self-pay | Admitting: Nurse Practitioner

## 2021-06-17 ENCOUNTER — Encounter: Payer: Self-pay | Admitting: Nurse Practitioner

## 2021-06-17 ENCOUNTER — Telehealth: Payer: Self-pay | Admitting: Nurse Practitioner

## 2021-06-17 NOTE — Telephone Encounter (Signed)
pATIENT IS HAVING A MENTAL BREAK (HER WORDS)she said her therapist requested blood work. She can't wait until next available for an appt. What should she do?

## 2022-12-29 ENCOUNTER — Encounter: Payer: Self-pay | Admitting: *Deleted

## 2023-01-19 ENCOUNTER — Ambulatory Visit: Payer: No Typology Code available for payment source | Admitting: Nurse Practitioner

## 2023-01-19 ENCOUNTER — Encounter: Payer: Self-pay | Admitting: Nurse Practitioner

## 2023-01-19 VITALS — BP 112/74 | HR 74 | Temp 98.5°F | Resp 16 | Ht 67.0 in | Wt 152.6 lb

## 2023-01-19 DIAGNOSIS — Z22338 Carrier of other streptococcus: Secondary | ICD-10-CM | POA: Insufficient documentation

## 2023-01-19 DIAGNOSIS — Z13220 Encounter for screening for lipoid disorders: Secondary | ICD-10-CM | POA: Diagnosis not present

## 2023-01-19 DIAGNOSIS — Z136 Encounter for screening for cardiovascular disorders: Secondary | ICD-10-CM

## 2023-01-19 DIAGNOSIS — L409 Psoriasis, unspecified: Secondary | ICD-10-CM

## 2023-01-19 DIAGNOSIS — Z1231 Encounter for screening mammogram for malignant neoplasm of breast: Secondary | ICD-10-CM

## 2023-01-19 DIAGNOSIS — F3132 Bipolar disorder, current episode depressed, moderate: Secondary | ICD-10-CM

## 2023-01-19 DIAGNOSIS — Z0001 Encounter for general adult medical examination with abnormal findings: Secondary | ICD-10-CM

## 2023-01-19 DIAGNOSIS — R5382 Chronic fatigue, unspecified: Secondary | ICD-10-CM

## 2023-01-19 HISTORY — DX: Carrier of other streptococcus: Z22.338

## 2023-01-19 LAB — VITAMIN D 25 HYDROXY (VIT D DEFICIENCY, FRACTURES): VITD: 41.44 ng/mL (ref 30.00–100.00)

## 2023-01-19 LAB — LIPID PANEL
Cholesterol: 183 mg/dL (ref 0–200)
HDL: 50.8 mg/dL (ref >39.00)
LDL Cholesterol: 117 mg/dL — ABNORMAL HIGH (ref 0–99)
NonHDL: 132.04
Total CHOL/HDL Ratio: 4
Triglycerides: 76 mg/dL (ref 0.0–149.0)
VLDL: 15.2 mg/dL (ref 0.0–40.0)

## 2023-01-19 LAB — COMPREHENSIVE METABOLIC PANEL
ALT: 8 U/L (ref 0–35)
AST: 13 U/L (ref 0–37)
Albumin: 4.4 g/dL (ref 3.5–5.2)
Alkaline Phosphatase: 40 U/L (ref 39–117)
BUN: 9 mg/dL (ref 6–23)
CO2: 28 mEq/L (ref 19–32)
Calcium: 9 mg/dL (ref 8.4–10.5)
Chloride: 99 mEq/L (ref 96–112)
Creatinine, Ser: 0.8 mg/dL (ref 0.40–1.20)
GFR: 90.33 mL/min (ref >60.00)
Glucose, Bld: 93 mg/dL (ref 70–99)
Potassium: 4 mEq/L (ref 3.5–5.1)
Sodium: 138 mEq/L (ref 135–145)
Total Bilirubin: 0.6 mg/dL (ref 0.2–1.2)
Total Protein: 6.9 g/dL (ref 6.0–8.3)

## 2023-01-19 LAB — CBC
HCT: 39.9 % (ref 36.0–46.0)
Hemoglobin: 13.5 g/dL (ref 12.0–15.0)
MCHC: 33.7 g/dL (ref 30.0–36.0)
MCV: 90.9 fl (ref 78.0–100.0)
Platelets: 222 10*3/uL (ref 150.0–400.0)
RBC: 4.39 Mil/uL (ref 3.87–5.11)
RDW: 13.4 % (ref 11.5–15.5)
WBC: 9 10*3/uL (ref 4.0–10.5)

## 2023-01-19 LAB — TSH: TSH: 1.34 u[IU]/mL (ref 0.35–5.50)

## 2023-01-19 LAB — VITAMIN B12: Vitamin B-12: 434 pg/mL (ref 211–911)

## 2023-01-19 MED ORDER — DESOXIMETASONE 0.25 % EX CREA
TOPICAL_CREAM | Freq: Two times a day (BID) | CUTANEOUS | 0 refills | Status: DC
Start: 2023-01-19 — End: 2024-01-27

## 2023-01-19 NOTE — Progress Notes (Signed)
Complete physical exam  Patient: Zubaida Degrandis   DOB: 15-Jul-1981   42 y.o. Female  MRN: [id number] Visit Date: 01/19/2023  Subjective:    Chief Complaint  Patient presents with   Annual Exam    Fasting- Yes    Marciela Arntz is a 42 y.o. female who presents today for a complete physical exam. She reports consuming a general diet.  No exercise due to depressive mood  She generally feels fairly well. She reports sleeping well. She does not have additional problems to discuss today.  Vision:No Dental:Yes STD Screen:No  BP Readings from Last 3 Encounters:  01/19/23 112/74  01/25/21 100/60  05/16/19 110/70   Wt Readings from Last 3 Encounters:  01/19/23 152 lb 9.6 oz (69.2 kg)  01/25/21 132 lb 3.2 oz (60 kg)  05/16/19 160 lb (72.6 kg)   Most recent fall risk assessment:    01/19/2023    9:26 AM  Fall Risk   Falls in the past year? 0  Number falls in past yr: 0  Injury with Fall? 0  Risk for fall due to : No Fall Risks  Follow up Falls evaluation completed   Depression screen:Yes - Depression  Most recent depression screenings:    01/19/2023    9:35 AM 01/25/2021   12:21 PM  PHQ 2/9 Scores  PHQ - 2 Score  6  PHQ- 9 Score  23  Exception Documentation Medical reason    Rash This is a recurrent problem. The current episode started more than 1 year ago. The problem has been waxing and waning since onset. The affected locations include the right elbow and left elbow. The rash is characterized by itchiness, scaling and peeling. She was exposed to nothing. Associated symptoms include fatigue. Past treatments include topical steroids. The treatment provided significant relief. There is no history of allergies, asthma or eczema.    Bipolar disorder (HCC) [facility], has appts every 6weeks.  Reports improved anxiety, but persistent depression Denies any SI/HI/hallucinations States she is compliant with medications.  Advised to  maintain appts with psychiatry and current medications Advised to discuss need for counseling (group and individual) and electroconvulsive therapy with psychiatrist.  Past Medical History:  Diagnosis Date   Anxiety Decemeber 5th   Ativan up to 3 mg a day   Depression    severe, seasonal sx - follows with [facility]   Lumbar herniated disc    Psoriasis    Sleep walking and eating    Thoracic disc herniation    Vaginal Pap smear, abnormal    Past Surgical History:  Procedure Laterality Date   COCCYGECTOMY     COLPOSCOPY     Social History   Socioeconomic History   Marital status: Married    Spouse name: Not on file   Number of children: Not on file   Years of education: Not on file   Highest education level: Not on file  Occupational History   Not on file  Tobacco Use   Smoking status: Never   Smokeless tobacco: Never   Tobacco comments:    married, Tree surgeon  Vaping Use   Vaping Use: Never used  Substance and Sexual Activity   Alcohol use: No   Drug use: No   Sexual activity: Yes    Birth control/protection: Condom  Other Topics Concern   Not on file  Social History Narrative   ** Merged History Encounter **       Social  Determinants of Health   Financial Resource Strain: Not on file  Food Insecurity: Not on file  Transportation Needs: Not on file  Physical Activity: Not on file  Stress: Not on file  Social Connections: Not on file  Intimate Partner Violence: Not on file   Family Status  Relation Name Status   Mother Merceda Elks Alive   Father  Alive   Sister  Alive   Brother  Alive   Mat Uncle Margo Common (Not Specified)   MGM  Deceased   Family History  Problem Relation Age of Onset   Diabetes Mother    Asthma Mother    Depression Mother    Hepatitis C Mother    Depression Maternal Uncle    Allergies  Allergen Reactions   Viibryd [Vilazodone Hcl] Rash    Rash and eyesight issues.     Patient Care Team: Yasmen Cortner, Bonna Gains, NP as PCP -  General (Internal Medicine) Genia Del, MD (Obstetrics and Gynecology) Milagros Evener, MD (Psychiatry)   Medications: Outpatient Medications Prior to Visit  Medication Sig   buPROPion (WELLBUTRIN XL) 300 MG 24 hr tablet Take 300 mg by mouth daily.   EPINEPHrine (EPIPEN 2-PAK) 0.3 mg/0.3 mL IJ SOAJ injection Inject 0.3 mg into the muscle as needed for anaphylaxis.   escitalopram (LEXAPRO) 20 MG tablet Take 20 mg by mouth daily.   lamoTRIgine (LAMICTAL) 100 MG tablet Take 100 mg by mouth daily.   LORazepam (ATIVAN) 0.5 MG tablet Take 0.5 mg by mouth every 8 (eight) hours as needed for anxiety.   VRAYLAR 1.5 MG capsule Take 1.5 mg by mouth.   No facility-administered medications prior to visit.    Review of Systems  Constitutional:  Positive for fatigue. Negative for activity change, appetite change and unexpected weight change.  Respiratory: Negative.    Cardiovascular: Negative.   Gastrointestinal: Negative.   Endocrine: Negative for cold intolerance and heat intolerance.  Genitourinary: Negative.   Musculoskeletal: Negative.   Skin:  Positive for rash.  Neurological: Negative.   Hematological: Negative.   Psychiatric/Behavioral:  Positive for behavioral problems, decreased concentration and dysphoric mood. Negative for agitation, confusion, hallucinations, self-injury, sleep disturbance and suicidal ideas. The patient is not nervous/anxious.         Objective:  BP 112/74 (BP Location: Right Arm, Patient Position: Sitting, Cuff Size: Normal)   Pulse 74   Temp 98.5 F (36.9 C) (Temporal)   Resp 16   Ht 5\' 7"  (1.702 m)   Wt 152 lb 9.6 oz (69.2 kg)   LMP 01/16/2023 (Exact Date)   SpO2 98%   BMI 23.90 kg/m     Physical Exam Vitals and nursing note reviewed.  Constitutional:      General: She is not in acute distress. HENT:     Right Ear: Tympanic membrane, ear canal and external ear normal.     Left Ear: Tympanic membrane, ear canal and external ear normal.      Nose: Nose normal.  Eyes:     Extraocular Movements: Extraocular movements intact.     Conjunctiva/sclera: Conjunctivae normal.     Pupils: Pupils are equal, round, and reactive to light.  Neck:     Thyroid: No thyroid mass, thyromegaly or thyroid tenderness.  Cardiovascular:     Rate and Rhythm: Normal rate and regular rhythm.     Pulses: Normal pulses.     Heart sounds: Normal heart sounds.  Pulmonary:     Effort: Pulmonary effort is normal.  Breath sounds: Normal breath sounds.  Chest:  Breasts:    Breasts are symmetrical.     Right: Normal.     Left: Normal.  Abdominal:     General: Bowel sounds are normal.     Palpations: Abdomen is soft.  Genitourinary:    Comments: Declined PAP today Also declined referral to GYN Musculoskeletal:        General: Normal range of motion.     Cervical back: Normal range of motion and neck supple.     Right lower leg: No edema.     Left lower leg: No edema.  Lymphadenopathy:     Cervical: No cervical adenopathy.     Upper Body:     Right upper body: No supraclavicular, axillary or pectoral adenopathy.     Left upper body: No supraclavicular, axillary or pectoral adenopathy.  Skin:    General: Skin is warm and dry.     Findings: Rash present. Rash is scaling.       Neurological:     Mental Status: She is alert and oriented to person, place, and time.     Cranial Nerves: No cranial nerve deficit.  Psychiatric:        Mood and Affect: Mood normal.        Behavior: Behavior normal.        Thought Content: Thought content normal.     No results found for any visits on 01/19/23.    Assessment & Plan:    Routine Health Maintenance and Physical Exam  Immunization History  Administered Date(s) Administered   Influenza Split 08/09/2012   Influenza,inj,Quad PF,6+ Mos 07/12/2013   Tdap 03/08/2019   Health Maintenance  Topic Date Due   PAP SMEAR-Modifier  03/07/2022   COVID-19 Vaccine (1) 02/04/2023 (Originally 10/06/1984)    Hepatitis C Screening  01/19/2024 (Originally 10/06/1997)   INFLUENZA VACCINE  04/16/2023   DTaP/Tdap/Td (2 - Td or Tdap) 03/07/2029   HIV Screening  Completed   HPV VACCINES  Aged Out   Discussed health benefits of physical activity, and encouraged her to engage in regular exercise appropriate for her age and condition.  Problem List Items Addressed This Visit       Musculoskeletal and Integument   Psoriasis   Relevant Medications   desoximetasone (TOPICORT) 0.25 % cream     Other   Bipolar disorder (HCC)    [facility], has appts every 6weeks.  Reports improved anxiety, but persistent depression Denies any SI/HI/hallucinations States she is compliant with medications.  Advised to maintain appts with psychiatry and current medications Advised to discuss need for counseling (group and individual) and electroconvulsive therapy with psychiatrist.      Other Visit Diagnoses     Encounter for preventative adult health care exam with abnormal findings    -  Primary   Relevant Orders   MM 3D SCREENING MAMMOGRAM BILATERAL BREAST   CBC   Comprehensive metabolic panel   Lipid panel   Breast cancer screening by mammogram       Relevant Orders   MM 3D SCREENING MAMMOGRAM BILATERAL BREAST   Encounter for lipid screening for cardiovascular disease       Relevant Orders   Lipid panel   Chronic fatigue       Relevant Orders   Vitamin B12   TSH   VITAMIN D 25 Hydroxy (Vit-D Deficiency, Fractures)      Return in about 1 year (around 01/19/2024) for  CPE (fasting).     Alysia Penna, NP

## 2023-01-19 NOTE — Patient Instructions (Addendum)
Please inquire from psychiatry about  group therapy Go to lab

## 2023-01-19 NOTE — Assessment & Plan Note (Signed)
Under the [facility] of [facility], has appts every 6weeks.  Reports improved anxiety, but persistent depression Denies any SI/HI/hallucinations States she is compliant with medications.  Advised to maintain appts with psychiatry and current medications Advised to discuss need for counseling (group and individual) and electroconvulsive therapy with psychiatrist.

## 2023-01-19 NOTE — Progress Notes (Signed)
Stable Follow instructions as discussed during office visit.

## 2023-02-02 ENCOUNTER — Ambulatory Visit
Admission: RE | Admit: 2023-02-02 | Discharge: 2023-02-02 | Disposition: A | Discharge status: Home / Self Care | Payer: No Typology Code available for payment source | Source: Ambulatory Visit | Attending: Nurse Practitioner | Admitting: Nurse Practitioner

## 2023-02-02 DIAGNOSIS — Z0001 Encounter for general adult medical examination with abnormal findings: Secondary | ICD-10-CM

## 2023-02-02 DIAGNOSIS — Z1231 Encounter for screening mammogram for malignant neoplasm of breast: Secondary | ICD-10-CM

## 2023-02-02 LAB — HM MAMMOGRAPHY

## 2023-02-05 ENCOUNTER — Other Ambulatory Visit: Payer: Self-pay | Admitting: Nurse Practitioner

## 2023-02-05 DIAGNOSIS — R928 Other abnormal and inconclusive findings on diagnostic imaging of breast: Secondary | ICD-10-CM

## 2023-02-19 ENCOUNTER — Other Ambulatory Visit: Payer: No Typology Code available for payment source

## 2023-04-15 ENCOUNTER — Encounter: Payer: Self-pay | Admitting: Nurse Practitioner

## 2023-07-02 ENCOUNTER — Telehealth: Payer: Self-pay | Admitting: Nurse Practitioner

## 2023-07-02 DIAGNOSIS — Z124 Encounter for screening for malignant neoplasm of cervix: Secondary | ICD-10-CM

## 2023-07-02 NOTE — Telephone Encounter (Signed)
Pt need referral for pap smear . Please give the pt a call [phone]

## 2023-08-18 ENCOUNTER — Ambulatory Visit (INDEPENDENT_AMBULATORY_CARE_PROVIDER_SITE_OTHER): Payer: No Typology Code available for payment source | Admitting: Obstetrics and Gynecology

## 2023-08-18 ENCOUNTER — Other Ambulatory Visit (HOSPITAL_COMMUNITY)
Admission: RE | Admit: 2023-08-18 | Discharge: 2023-08-18 | Disposition: A | Discharge status: Home / Self Care | Payer: No Typology Code available for payment source | Source: Ambulatory Visit | Attending: Obstetrics and Gynecology | Admitting: Obstetrics and Gynecology

## 2023-08-18 ENCOUNTER — Encounter: Payer: Self-pay | Admitting: Obstetrics and Gynecology

## 2023-08-18 VITALS — BP 116/72 | HR 64 | Ht 67.0 in | Wt 174.3 lb

## 2023-08-18 DIAGNOSIS — Z113 Encounter for screening for infections with a predominantly sexual mode of transmission: Secondary | ICD-10-CM | POA: Insufficient documentation

## 2023-08-18 DIAGNOSIS — B3731 Acute candidiasis of vulva and vagina: Secondary | ICD-10-CM | POA: Insufficient documentation

## 2023-08-18 DIAGNOSIS — Z01419 Encounter for gynecological examination (general) (routine) without abnormal findings: Secondary | ICD-10-CM | POA: Diagnosis not present

## 2023-08-18 DIAGNOSIS — N760 Acute vaginitis: Secondary | ICD-10-CM | POA: Insufficient documentation

## 2023-08-18 DIAGNOSIS — B9689 Other specified bacterial agents as the cause of diseases classified elsewhere: Secondary | ICD-10-CM | POA: Insufficient documentation

## 2023-08-18 NOTE — Progress Notes (Signed)
ANNUAL EXAM Patient name: Valerie Livingston MRN [id number]  Date of birth: Dec 25, 1980 Chief Complaint:   Annual Exam  History of Present Illness:   Valerie Livingston is a 42 y.o. G73P2002 Caucasian female being seen today for a routine annual exam.  Current complaints: None. Needs pap smear. UTD with care through PCP otherwise.  Patient's last menstrual period was 08/05/2023 (approximate).   The pregnancy intention screening data noted above was reviewed. Potential methods of contraception were discussed. The patient elected to proceed with No data recorded.   Last pap several years ago. Results were:  normal per pt . H/O abnormal pap: yes cone biopsy 2011 Last mammogram: earlier this year. Results were: need repeat. Family h/o breast cancer: no Last colonoscopy: managed per PCP     01/25/2021   12:21 PM 03/08/2019    1:49 PM 01/28/2017   11:29 AM  Depression screen PHQ 2/9  Decreased Interest 3 0 1  Down, Depressed, Hopeless 3 0 1  PHQ - 2 Score 6 0 2  Altered sleeping 3  0  Tired, decreased energy 3  0  Change in appetite 3  0  Feeling bad or failure about yourself  3  0  Trouble concentrating 2  0  Moving slowly or fidgety/restless 0  0  Suicidal thoughts 3  0  PHQ-9 Score 23  2  Difficult doing work/chores Extremely dIfficult          01/25/2021   12:22 PM  GAD 7 : Generalized Anxiety Score  Nervous, Anxious, on Edge 3  Control/stop worrying 3  Worry too much - different things 3  Trouble relaxing 3  Restless 0  Easily annoyed or irritable 0  Afraid - awful might happen 3  Total GAD 7 Score 15  Anxiety Difficulty Extremely difficult     Review of Systems:   Pertinent items are noted in HPI Denies any headaches, blurred vision, fatigue, shortness of breath, chest pain, abdominal pain, abnormal vaginal discharge/itching/odor/irritation, problems with periods, bowel movements, urination, or intercourse unless otherwise stated above. Pertinent History Reviewed:   Reviewed past medical,surgical, social and family history.  Reviewed problem list, medications and allergies. Physical Assessment:   Vitals:   08/18/23 1017  BP: 116/72  Pulse: 64  Weight: 174 lb 4.8 oz (79.1 kg)  Height: 5\' 7"  (1.702 m)  Body mass index is 27.3 kg/m.        Physical Examination:   General appearance - well appearing, and in no distress  Mental status - alert, oriented to person, place, and time  Psych:  She has a normal mood and affect  Skin - warm and dry, normal color, no suspicious lesions noted  Chest - effort normal  Heart - normal rate and regular rhythm  Neck:  midline trachea, no thyromegaly or nodules  Breasts - breasts appear normal, no suspicious masses, no skin or nipple changes or  axillary nodes  Abdomen - soft, nontender, nondistended, no masses or organomegaly  Pelvic - VULVA: normal appearing vulva with no masses, tenderness or lesions  VAGINA: normal appearing vagina with normal color and discharge, no lesions  CERVIX: normal appearing cervix without discharge or lesions, no CMT  Thin prep pap is done and HR HPV cotesting  UTERUS: uterus is felt to be normal size, shape, consistency and nontender   ADNEXA: No adnexal masses or tenderness noted.  Extremities:  No swelling or varicosities noted  Chaperone present for exam  No results found for this or any  previous visit (from the past 24 hour(s)).  Assessment & Plan:  1) Well-Woman Exam: Pap collected and STI screening. Otherwise care received through her PCP. Did encourage her to reschedule repeat MMG as first did not have sufficient imaging per her.  Labs/procedures today: pap, labs  Orders Placed This Encounter  Procedures   HIV antibody (with reflex)   RPR   Hepatitis C Antibody   Hepatitis B Surface AntiGEN    Meds: No orders of the defined types were placed in this encounter.   Follow-up: Return if symptoms worsen or fail to improve.  Joanne Gavel, MD 08/18/2023 1:13  PM

## 2023-08-19 LAB — HEPATITIS B SURFACE ANTIGEN: Hepatitis B Surface Ag: NEGATIVE

## 2023-08-19 LAB — RPR: RPR Ser Ql: NONREACTIVE

## 2023-08-19 LAB — CERVICOVAGINAL ANCILLARY ONLY
Bacterial Vaginitis (gardnerella): POSITIVE — AB
Candida Glabrata: NEGATIVE
Candida Vaginitis: POSITIVE — AB
Chlamydia: NEGATIVE
Comment: NEGATIVE
Comment: NEGATIVE
Comment: NEGATIVE
Comment: NEGATIVE
Comment: NEGATIVE
Comment: NORMAL
Neisseria Gonorrhea: NEGATIVE
Trichomonas: NEGATIVE

## 2023-08-19 LAB — HIV ANTIBODY (ROUTINE TESTING W REFLEX): HIV Screen 4th Generation wRfx: NONREACTIVE

## 2023-08-19 LAB — HEPATITIS C ANTIBODY: Hep C Virus Ab: NONREACTIVE

## 2023-08-21 LAB — CYTOLOGY - PAP
Adequacy: ABSENT
Comment: NEGATIVE
Diagnosis: NEGATIVE
High risk HPV: NEGATIVE

## 2023-09-02 ENCOUNTER — Ambulatory Visit
Admission: RE | Admit: 2023-09-02 | Discharge: 2023-09-02 | Disposition: A | Discharge status: Home / Self Care | Payer: No Typology Code available for payment source | Source: Ambulatory Visit | Attending: Nurse Practitioner

## 2023-09-02 DIAGNOSIS — R928 Other abnormal and inconclusive findings on diagnostic imaging of breast: Secondary | ICD-10-CM

## 2023-11-04 ENCOUNTER — Ambulatory Visit: Payer: No Typology Code available for payment source | Admitting: Nurse Practitioner

## 2023-11-09 ENCOUNTER — Ambulatory Visit (INDEPENDENT_AMBULATORY_CARE_PROVIDER_SITE_OTHER): Payer: No Typology Code available for payment source | Admitting: Nurse Practitioner

## 2023-11-09 ENCOUNTER — Encounter: Payer: Self-pay | Admitting: Nurse Practitioner

## 2023-11-09 ENCOUNTER — Other Ambulatory Visit (HOSPITAL_COMMUNITY)
Admission: RE | Admit: 2023-11-09 | Discharge: 2023-11-09 | Disposition: A | Discharge status: Home / Self Care | Payer: No Typology Code available for payment source | Source: Ambulatory Visit | Attending: Nurse Practitioner | Admitting: Nurse Practitioner

## 2023-11-09 VITALS — BP 122/70 | HR 74 | Temp 98.7°F | Ht 67.0 in | Wt 162.0 lb

## 2023-11-09 DIAGNOSIS — F3132 Bipolar disorder, current episode depressed, moderate: Secondary | ICD-10-CM | POA: Diagnosis not present

## 2023-11-09 DIAGNOSIS — Z113 Encounter for screening for infections with a predominantly sexual mode of transmission: Secondary | ICD-10-CM

## 2023-11-09 NOTE — Assessment & Plan Note (Signed)
 Under the care psychiatry with [facility]. Previous use of Ketamine with minimal improvement. Unable to afford electroconvulsive therapy. Stable mood with vraylar, lamictal, ativan, lexapro, and wellbutrin Denies any SI/HI/hallucination

## 2023-11-09 NOTE — Progress Notes (Signed)
   Acute Office Visit  Subjective:    Patient ID: Valerie Livingston, female    DOB: 07/27/1981, 43 y.o.   MRN: [id number]  Chief Complaint  Patient presents with   Vaginal Itching    STI testing requested    Patient is in today for concerns about possible STI and BV.  She has new female partner who tested positive for BV. She had STI screen 85month ago which was negative. She also tested positive for BV and yeast and the time, but had no symptoms at the time. She denies any pelvic pain or fever or vaginal symptoms at this time. She denies need for repeat HIV or RPR or hepatitis screen at this time  Outpatient Medications Prior to Visit  Medication Sig   buPROPion (WELLBUTRIN XL) 300 MG 24 hr tablet Take 300 mg by mouth daily.   desoximetasone (TOPICORT) 0.25 % cream Apply topically 2 (two) times daily.   EPINEPHrine (EPIPEN 2-PAK) 0.3 mg/0.3 mL IJ SOAJ injection Inject 0.3 mg into the muscle as needed for anaphylaxis.   escitalopram (LEXAPRO) 20 MG tablet Take 20 mg by mouth daily.   lamoTRIgine (LAMICTAL) 100 MG tablet Take 100 mg by mouth daily.   LORazepam (ATIVAN) 0.5 MG tablet Take 0.5 mg by mouth every 8 (eight) hours as needed for anxiety.   VRAYLAR 1.5 MG capsule Take 1.5 mg by mouth.   No facility-administered medications prior to visit.   Reviewed past medical and social history.  Review of Systems Per HPI     Objective:    Physical Exam Vitals reviewed.  Genitourinary:    Comments: Opted to self swab Neurological:     Mental Status: She is alert.     BP 122/70 (BP Location: Left Arm, Patient Position: Sitting, Cuff Size: Normal)   Pulse 74   Temp 98.7 F (37.1 C)   Ht 5\' 7"  (1.702 m)   Wt 162 lb (73.5 kg)   SpO2 98%   Breastfeeding No   BMI 25.37 kg/m    No results found for any visits on 11/09/23.      Assessment & Plan:   Problem List Items Addressed This Visit     Bipolar disorder [facility])   Under the care psychiatry with [facility]. Previous use of Ketamine with minimal improvement. Unable to afford electroconvulsive therapy. Stable mood with vraylar, lamictal, ativan, lexapro, and wellbutrin Denies any SI/HI/hallucination      Other Visit Diagnoses       Routine screening for STI (sexually transmitted infection)    -  Primary   Relevant Orders   Cervicovaginal ancillary only( [place])      No orders of the defined types were placed in this encounter.  Return in about 3 months (around 02/06/2024) for CPE (fasting).    Alysia Penna, NP

## 2023-11-09 NOTE — Patient Instructions (Addendum)

## 2023-11-10 ENCOUNTER — Encounter: Payer: Self-pay | Admitting: Nurse Practitioner

## 2023-11-10 LAB — CERVICOVAGINAL ANCILLARY ONLY
Bacterial Vaginitis (gardnerella): NEGATIVE
Candida Glabrata: NEGATIVE
Candida Vaginitis: NEGATIVE
Chlamydia: NEGATIVE
Comment: NEGATIVE
Comment: NEGATIVE
Comment: NEGATIVE
Comment: NEGATIVE
Comment: NEGATIVE
Comment: NORMAL
Neisseria Gonorrhea: NEGATIVE
Trichomonas: NEGATIVE

## 2024-01-27 ENCOUNTER — Encounter: Payer: Self-pay | Admitting: Nurse Practitioner

## 2024-01-27 ENCOUNTER — Ambulatory Visit (INDEPENDENT_AMBULATORY_CARE_PROVIDER_SITE_OTHER): Payer: No Typology Code available for payment source | Admitting: Nurse Practitioner

## 2024-01-27 VITALS — BP 110/74 | HR 99 | Temp 97.1°F | Ht 66.0 in | Wt 157.8 lb

## 2024-01-27 DIAGNOSIS — Z0001 Encounter for general adult medical examination with abnormal findings: Secondary | ICD-10-CM

## 2024-01-27 DIAGNOSIS — L409 Psoriasis, unspecified: Secondary | ICD-10-CM

## 2024-01-27 DIAGNOSIS — E7800 Pure hypercholesterolemia, unspecified: Secondary | ICD-10-CM | POA: Insufficient documentation

## 2024-01-27 LAB — LIPID PANEL
Cholesterol: 254 mg/dL — ABNORMAL HIGH (ref 0–200)
HDL: 58.4 mg/dL (ref >39.00)
LDL Cholesterol: 176 mg/dL — ABNORMAL HIGH (ref 0–99)
NonHDL: 195.2
Total CHOL/HDL Ratio: 4
Triglycerides: 96 mg/dL (ref 0.0–149.0)
VLDL: 19.2 mg/dL (ref 0.0–40.0)

## 2024-01-27 LAB — COMPREHENSIVE METABOLIC PANEL WITH GFR
ALT: 11 U/L (ref 0–35)
AST: 15 U/L (ref 0–37)
Albumin: 4.8 g/dL (ref 3.5–5.2)
Alkaline Phosphatase: 43 U/L (ref 39–117)
BUN: 15 mg/dL (ref 6–23)
CO2: 30 meq/L (ref 19–32)
Calcium: 9.4 mg/dL (ref 8.4–10.5)
Chloride: 99 meq/L (ref 96–112)
Creatinine, Ser: 0.73 mg/dL (ref 0.40–1.20)
GFR: 100.1 mL/min (ref >60.00)
Glucose, Bld: 96 mg/dL (ref 70–99)
Potassium: 4 meq/L (ref 3.5–5.1)
Sodium: 136 meq/L (ref 135–145)
Total Bilirubin: 0.9 mg/dL (ref 0.2–1.2)
Total Protein: 7.4 g/dL (ref 6.0–8.3)

## 2024-01-27 MED ORDER — DESOXIMETASONE 0.25 % EX CREA: TOPICAL_CREAM | Freq: Two times a day (BID) | CUTANEOUS | 3 refills | Status: AC

## 2024-01-27 NOTE — Patient Instructions (Addendum)
 Compound W for plantar wart  Go to lab Maintain Heart healthy diet and daily exercise.  Health Maintenance, Female Adopting a healthy lifestyle and getting preventive care are important in promoting health and wellness. Ask your health care provider about: The right schedule for you to have regular tests and exams. Things you can do on your own to prevent diseases and keep yourself healthy. What should I know about diet, weight, and exercise? Eat a healthy diet  Eat a diet that includes plenty of vegetables, fruits, low-fat dairy products, and lean protein. Do not eat a lot of foods that are high in solid fats, added sugars, or sodium. Maintain a healthy weight Body mass index (BMI) is used to identify weight problems. It estimates body fat based on height and weight. Your health care provider can help determine your BMI and help you achieve or maintain a healthy weight. Get regular exercise Get regular exercise. This is one of the most important things you can do for your health. Most adults should: Exercise for at least 150 minutes each week. The exercise should increase your heart rate and make you sweat (moderate-intensity exercise). Do strengthening exercises at least twice a week. This is in addition to the moderate-intensity exercise. Spend less time sitting. Even light physical activity can be beneficial. Watch cholesterol and blood lipids Have your blood tested for lipids and cholesterol at 44 years of age, then have this test every 5 years. Have your cholesterol levels checked more often if: Your lipid or cholesterol levels are high. You are older than 44 years of age. You are at high risk for heart disease. What should I know about cancer screening? Depending on your health history and family history, you may need to have cancer screening at various ages. This may include screening for: Breast cancer. Cervical cancer. Colorectal cancer. Skin cancer. Lung cancer. What  should I know about heart disease, diabetes, and high blood pressure? Blood pressure and heart disease High blood pressure causes heart disease and increases the risk of stroke. This is more likely to develop in people who have high blood pressure readings or are overweight. Have your blood pressure checked: Every 3-5 years if you are 12-70 years of age. Every year if you are 16 years old or older. Diabetes Have regular diabetes screenings. This checks your fasting blood sugar level. Have the screening done: Once every three years after age 103 if you are at a normal weight and have a low risk for diabetes. More often and at a younger age if you are overweight or have a high risk for diabetes. What should I know about preventing infection? Hepatitis B If you have a higher risk for hepatitis B, you should be screened for this virus. Talk with your health care provider to find out if you are at risk for hepatitis B infection. Hepatitis C Testing is recommended for: Everyone born from [birth year] through 1966. Anyone with known risk factors for hepatitis C. Sexually transmitted infections (STIs) Get screened for STIs, including gonorrhea and chlamydia, if: You are sexually active and are younger than 44 years of age. You are older than 44 years of age and your health care provider tells you that you are at risk for this type of infection. Your sexual activity has changed since you were last screened, and you are at increased risk for chlamydia or gonorrhea. Ask your health care provider if you are at risk. Ask your health care provider about whether you are at  high risk for HIV. Your health care provider may recommend a prescription medicine to help prevent HIV infection. If you choose to take medicine to prevent HIV, you should first get tested for HIV. You should then be tested every 3 months for as long as you are taking the medicine. Pregnancy If you are about to stop having your period  (premenopausal) and you may become pregnant, seek counseling before you get pregnant. Take 400 to 800 micrograms (mcg) of folic acid every day if you become pregnant. Ask for birth control (contraception) if you want to prevent pregnancy. Osteoporosis and menopause Osteoporosis is a disease in which the bones lose minerals and strength with aging. This can result in bone fractures. If you are 55 years old or older, or if you are at risk for osteoporosis and fractures, ask your health care provider if you should: Be screened for bone loss. Take a calcium or vitamin D  supplement to lower your risk of fractures. Be given hormone replacement therapy (HRT) to treat symptoms of menopause. Follow these instructions at home: Alcohol use Do not drink alcohol if: Your health care provider tells you not to drink. You are pregnant, may be pregnant, or are planning to become pregnant. If you drink alcohol: Limit how much you have to: 0-1 drink a day. Know how much alcohol is in your drink. In the [place] one drink equals one 12 oz bottle of beer (355 mL), one 5 oz glass of wine (148 mL), or one 1 oz glass of hard liquor (44 mL). Lifestyle Do not use any products that contain nicotine or tobacco. These products include cigarettes, chewing tobacco, and vaping devices, such as e-cigarettes. If you need help quitting, ask your health care provider. Do not use street drugs. Do not share needles. Ask your health care provider for help if you need support or information about quitting drugs. General instructions Schedule regular health, dental, and eye exams. Stay current with your vaccines. Tell your health care provider if: You often feel depressed. You have ever been abused or do not feel safe at home. Summary Adopting a healthy lifestyle and getting preventive care are important in promoting health and wellness. Follow your health care provider's instructions about healthy diet, exercising, and getting  tested or screened for diseases. Follow your health care provider's instructions on monitoring your cholesterol and blood pressure. This information is not intended to replace advice given to you by your health care provider. Make sure you discuss any questions you have with your health care provider. Document Revised: 01/21/2021 Document Reviewed: 01/21/2021 Elsevier Patient Education  2024 [employer].

## 2024-01-27 NOTE — Progress Notes (Signed)
 Complete physical exam  Patient: Valerie Livingston   DOB: April 08, 1981   43 y.o. Female  MRN: [id number] Visit Date: 01/27/2024  Subjective:     Chief Complaint  Patient presents with   Annual Exam    No concerns and is fasting for blood work   Valerie Livingston is a 43 y.o. female who presents today for a complete physical exam. She reports consuming a general diet. No exercise regimen She generally feels poorly. She reports sleeping well. She does have additional problems to discuss today.  Vision:No Dental:Yes STD Screen:No  BP Readings from Last 3 Encounters:  01/27/24 110/74  11/09/23 122/70  08/18/23 116/72   Wt Readings from Last 3 Encounters:  01/27/24 157 lb 12.8 oz (71.6 kg)  11/09/23 162 lb (73.5 kg)  08/18/23 174 lb 4.8 oz (79.1 kg)    Most recent fall risk assessment:    01/27/2024    9:09 AM  Fall Risk   Falls in the past year? 0  Number falls in past yr: 0  Injury with Fall? 0     Depression screen:Yes - Depression Most recent depression screenings:    01/27/2024    9:12 AM 01/19/2023    9:35 AM  PHQ 2/9 Scores  PHQ - 2 Score 2   PHQ- 9 Score 6   Exception Documentation  Medical reason   HPI  No problem-specific Assessment & Plan notes found for this encounter.   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   Treat in labor     Depression    severe, seasonal sx - follows with [facility]   Lumbar herniated disc    Psoriasis    Sleep walking and eating    Thoracic disc herniation    Vaginal Pap smear, abnormal    Past Surgical History:  Procedure Laterality Date   COCCYGECTOMY     COLPOSCOPY     Social History   Socioeconomic History   Marital status: Married    Spouse name: Not on file   Number of children: Not on file   Years of education: Not on file   Highest education level: Not on file  Occupational History    Not on file  Tobacco Use   Smoking status: Never   Smokeless tobacco: Never   Tobacco comments:    married, Tree surgeon  Vaping Use   Vaping status: Never Used  Substance and Sexual Activity   Alcohol use: No   Drug use: No   Sexual activity: Yes    Birth control/protection: None  Other Topics Concern   Not on file  Social History Narrative   ** Merged History Encounter **       Social Drivers of Corporate investment banker Strain: Not on file  Food Insecurity: Not on file  Transportation Needs: Not on file  Physical Activity: Not on file  Stress: Not on file  Social Connections: Not on file  Intimate Partner Violence: Not on file   Family Status  Relation Name Status   Mother Audie Leander Alive   Father  Alive   Sister  Alive   Mat Uncle Golden Late (Not Specified)   MGM  Deceased   Brother  Alive   Neg Hx  (Not Specified)  No partnership data on file   Family History  Problem Relation Age  of Onset   Diabetes Mother    Asthma Mother    Depression Mother    Hepatitis C Mother    Depression Maternal Uncle    Breast cancer Neg Hx    Allergies  Allergen Reactions   Viibryd [Vilazodone Hcl] Rash    Rash and eyesight issues.     Patient Care Team: Eliakim Tendler, Connye Delaine, NP as PCP - General (Internal Medicine) Lavoie, Marie-Lyne, MD (Obstetrics and Gynecology) Daphine Eagle, MD (Psychiatry)   Medications: Outpatient Medications Prior to Visit  Medication Sig   Armodafinil 250 MG tablet Take 250 mg by mouth daily.   buPROPion (WELLBUTRIN XL) 150 MG 24 hr tablet Take 150 mg by mouth every morning.   lamoTRIgine (LAMICTAL) 150 MG tablet Take 150 mg by mouth daily.   LORazepam  (ATIVAN ) 1 MG tablet Take 0.5-1 mg by mouth every 6 (six) hours as needed.   pramipexole (MIRAPEX) 1 MG tablet Take 1 mg by mouth 1 day or 1 dose.   VRAYLAR 3 MG capsule Take 3 mg by mouth at bedtime.   [DISCONTINUED] buPROPion (WELLBUTRIN XL) 300 MG 24 hr tablet Take 150 mg by mouth  daily.   [DISCONTINUED] desoximetasone  (TOPICORT ) 0.25 % cream Apply topically 2 (two) times daily.   [DISCONTINUED] lamoTRIgine (LAMICTAL) 100 MG tablet Take 150 mg by mouth daily.   [DISCONTINUED] LORazepam  (ATIVAN ) 0.5 MG tablet Take 0.5 mg by mouth every 8 (eight) hours as needed for anxiety.   [DISCONTINUED] VRAYLAR 1.5 MG capsule Take 3 mg by mouth.   [DISCONTINUED] EPINEPHrine  (EPIPEN  2-PAK) 0.3 mg/0.3 mL IJ SOAJ injection Inject 0.3 mg into the muscle as needed for anaphylaxis. (Patient not taking: Reported on 01/27/2024)   [DISCONTINUED] escitalopram  (LEXAPRO ) 20 MG tablet Take 20 mg by mouth daily. (Patient not taking: Reported on 01/27/2024)   No facility-administered medications prior to visit.    Review of Systems  Constitutional:  Negative for activity change, appetite change and unexpected weight change.  Respiratory: Negative.    Cardiovascular: Negative.   Gastrointestinal: Negative.   Endocrine: Negative for cold intolerance and heat intolerance.  Genitourinary: Negative.   Musculoskeletal: Negative.   Skin: Negative.   Neurological: Negative.   Hematological: Negative.   Psychiatric/Behavioral:  Negative for behavioral problems, decreased concentration, dysphoric mood, hallucinations, self-injury, sleep disturbance and suicidal ideas. The patient is not nervous/anxious.     Last CBC Lab Results  Component Value Date   WBC 9.0 01/19/2023   HGB 13.5 01/19/2023   HCT 39.9 01/19/2023   MCV 90.9 01/19/2023   MCH 29.0 03/08/2019   RDW 13.4 01/19/2023   PLT 222.0 01/19/2023        Objective:  BP 110/74 (BP Location: Left Arm, Cuff Size: Normal)   Pulse 99   Temp (!) 97.1 F (36.2 C) (Temporal)   Ht 5\' 6"  (1.676 m)   Wt 157 lb 12.8 oz (71.6 kg)   LMP 01/25/2024 (Exact Date)   BMI 25.47 kg/m     Physical Exam Vitals and nursing note reviewed.  Constitutional:      General: She is not in acute distress. HENT:     Right Ear: Tympanic membrane, ear canal  and external ear normal.     Left Ear: Tympanic membrane, ear canal and external ear normal.     Nose: Nose normal.  Eyes:     Extraocular Movements: Extraocular movements intact.     Conjunctiva/sclera: Conjunctivae normal.     Pupils: Pupils are equal, round, and reactive to light.  Neck:     Thyroid : No thyroid  mass, thyromegaly or thyroid  tenderness.  Cardiovascular:     Rate and Rhythm: Normal rate and regular rhythm.     Pulses: Normal pulses.     Heart sounds: Normal heart sounds.  Pulmonary:     Effort: Pulmonary effort is normal.     Breath sounds: Normal breath sounds.  Abdominal:     General: Bowel sounds are normal.     Palpations: Abdomen is soft.  Musculoskeletal:        General: Normal range of motion.     Cervical back: Normal range of motion and neck supple.     Right lower leg: No edema.     Left lower leg: No edema.  Lymphadenopathy:     Cervical: No cervical adenopathy.  Skin:    General: Skin is warm and dry.  Neurological:     Mental Status: She is alert and oriented to person, place, and time.     Cranial Nerves: No cranial nerve deficit.  Psychiatric:        Mood and Affect: Mood normal.        Behavior: Behavior normal.        Thought Content: Thought content normal.      No results found for any visits on 01/27/24.    Assessment & Plan:    Routine Health Maintenance and Physical Exam  Immunization History  Administered Date(s) Administered   Influenza Split 08/09/2012   Influenza, Mdck, Trivalent,PF 6+ MOS(egg free) 07/12/2013   Influenza,inj,Quad PF,6+ Mos 07/12/2013   Tdap 03/08/2019    Health Maintenance  Topic Date Due   COVID-19 Vaccine (1) Never done   INFLUENZA VACCINE  04/15/2024   Cervical Cancer Screening (HPV/Pap Cotest)  08/17/2028   DTaP/Tdap/Td (2 - Td or Tdap) 03/07/2029   Hepatitis C Screening  Completed   HIV Screening  Completed   HPV VACCINES  Aged Out   Meningococcal B Vaccine  Aged Out    Discussed health  benefits of physical activity, and encouraged her to engage in regular exercise appropriate for her age and condition.  Problem List Items Addressed This Visit     Psoriasis   Relevant Medications   desoximetasone  (TOPICORT ) 0.25 % cream   Other Visit Diagnoses       Encounter for preventative adult health care exam with abnormal findings    -  Primary   Relevant Orders   Comprehensive metabolic panel with GFR     Elevated LDL cholesterol level       Relevant Orders   Lipid panel      Return in about 1 year (around 01/26/2025) for CPE (fasting).     Kathrene Parents, NP

## 2024-01-29 ENCOUNTER — Ambulatory Visit: Payer: Self-pay | Admitting: Nurse Practitioner

## 2024-06-13 ENCOUNTER — Ambulatory Visit (INDEPENDENT_AMBULATORY_CARE_PROVIDER_SITE_OTHER): Admitting: Internal Medicine

## 2024-06-13 ENCOUNTER — Other Ambulatory Visit (HOSPITAL_COMMUNITY)
Admission: RE | Admit: 2024-06-13 | Discharge: 2024-06-13 | Disposition: A | Discharge status: Home / Self Care | Source: Ambulatory Visit | Attending: Internal Medicine | Admitting: Internal Medicine

## 2024-06-13 ENCOUNTER — Encounter: Payer: Self-pay | Admitting: Internal Medicine

## 2024-06-13 VITALS — BP 120/74 | HR 71 | Temp 98.0°F | Ht 67.0 in | Wt 162.0 lb

## 2024-06-13 DIAGNOSIS — N898 Other specified noninflammatory disorders of vagina: Secondary | ICD-10-CM | POA: Insufficient documentation

## 2024-06-13 MED ORDER — CLINDAMYCIN PHOSPHATE 2 % VA CREA: 1.0000 | TOPICAL_CREAM | Freq: Every day | VAGINAL | 0 refills | Status: DC

## 2024-06-13 NOTE — Progress Notes (Signed)
 [facility] [address] [place] [address] Dept: [phone] Dept Fax: [phone]  Acute Care Office Visit  Subjective:   Valerie Livingston 1981/05/26 06/13/2024  Chief Complaint  Patient presents with   Vaginal Discharge    Discharge    HPI:  VAGINAL DISCHARGE: Onset: over the past couple weeks.    Description: thick, white discharge   Patient reports she tested + for BV last year , was asymptomatic, never received treatment.  Patient reports her female sexual partner was recently dx and tx for BV. She is requesting treatment regardless of results. She does not want to do Flagyl, prefers Clindamycin. Patient's female partner is also sexually active with female partner.   Symptoms Odor:  no   Itching: no  Dysuria: no Bleeding: no  Pelvic pain: no  Fever: no  Genital sores: no  Rash: no  Prior treatment: no   Red Flags: Missed period: no  Pregnancy: no  Recent antibiotics: no  Sexual activity: yes  Possible STD exposure: no, just BV  IUD: no  Diabetes: no    The following portions of the patient's history were reviewed and updated as appropriate: past medical history, past surgical history, family history, social history, allergies, medications, and problem list.   Patient Active Problem List   Diagnosis Date Noted   Elevated LDL cholesterol level 01/27/2024   Acute medial meniscus tear of left knee 06/29/2018   Back pain 01/28/2017   Dysplastic nevus of skin 01/28/2017   Positive GBS test 02/09/2014   H/O cone biopsy of cervix 2011 02/09/2014   Inguinal hernia--mild, left side. 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   Treat in labor     Depression    severe, seasonal sx - follows with K[facility]   Lumbar  herniated disc    Psoriasis    Sleep walking and eating    Thoracic disc herniation    Vaginal Pap smear, abnormal    Past Surgical History:  Procedure Laterality Date   COCCYGECTOMY     COLPOSCOPY     Family History  Problem Relation Age of Onset   Diabetes Mother    Asthma Mother    Depression Mother    Hepatitis C Mother    Depression Maternal Uncle    Breast cancer Neg Hx     Current Outpatient Medications:    clindamycin (CLEOCIN) 2 % vaginal cream, Place 1 Applicatorful vaginally at bedtime for 7 days., Disp: 40 g, Rfl: 0   Armodafinil 250 MG tablet, Take 250 mg by mouth daily., Disp: , Rfl:    buPROPion (WELLBUTRIN XL) 150 MG 24 hr tablet, Take 150 mg by mouth every morning., Disp: , Rfl:    desoximetasone  (TOPICORT ) 0.25 % cream, Apply topically 2 (two) times daily., Disp: 45 g, Rfl: 3   lamoTRIgine (LAMICTAL) 150 MG tablet, Take 150 mg by mouth daily., Disp: , Rfl:    LORazepam  (ATIVAN ) 1 MG tablet, Take 0.5-1 mg by mouth every 6 (six) hours as needed., Disp: , Rfl:    pramipexole (MIRAPEX) 1 MG tablet, Take 1 mg by mouth 1 day or 1 dose., Disp: , Rfl:    VRAYLAR 3 MG capsule, Take 3 mg by mouth at bedtime., Disp: ,  Rfl:  Allergies  Allergen Reactions   Viibryd [Vilazodone Hcl] Rash    Rash and eyesight issues.      ROS: A complete ROS was performed with pertinent positives/negatives noted in the HPI. The remainder of the ROS are negative.    Objective:   Today's Vitals   06/13/24 0928  BP: 120/74  Pulse: 71  Temp: 98 F (36.7 C)  TempSrc: Temporal  SpO2: 98%  Weight: 162 lb (73.5 kg)  Height: 5' 7 (1.702 m)    GENERAL: Well-appearing, in NAD. Well nourished.  SKIN: Pink, warm and dry.  RESPIRATORY: Chest wall symmetrical. Respirations even and non-labored. Breath sounds clear to auscultation bilaterally.  CARDIAC: S1, S2 present, regular rate and rhythm. Peripheral pulses 2+ bilaterally.  ABDOMEN: Soft, non-tender.  PSYCH/MENTAL STATUS: Alert,  oriented x 3. Cooperative, appropriate mood and affect.    No results found for any visits on 06/13/24.    Assessment & Plan:  1. Vaginal discharge (Primary) - Cervicovaginal ancillary only - clindamycin (CLEOCIN) 2 % vaginal cream; Place 1 Applicatorful vaginally at bedtime for 7 days.  Dispense: 40 g; Refill: 0   Meds ordered this encounter  Medications   clindamycin (CLEOCIN) 2 % vaginal cream    Sig: Place 1 Applicatorful vaginally at bedtime for 7 days.    Dispense:  40 g    Refill:  0    Supervising Provider:   SEBASTIAN BEVERLEY NOVAK [[id number]]   No orders of the defined types were placed in this encounter.  Lab Orders  No laboratory test(s) ordered today   No images are attached to the encounter or orders placed in the encounter.  Return if symptoms worsen or fail to improve.   Rosina Senters, FNP

## 2024-06-14 ENCOUNTER — Ambulatory Visit: Payer: Self-pay | Admitting: Internal Medicine

## 2024-06-14 DIAGNOSIS — B3731 Acute candidiasis of vulva and vagina: Secondary | ICD-10-CM

## 2024-06-14 LAB — CERVICOVAGINAL ANCILLARY ONLY
Bacterial Vaginitis (gardnerella): NEGATIVE
Candida Glabrata: NEGATIVE
Candida Vaginitis: POSITIVE — AB
Chlamydia: NEGATIVE
Comment: NEGATIVE
Comment: NEGATIVE
Comment: NEGATIVE
Comment: NEGATIVE
Comment: NEGATIVE
Comment: NORMAL
Neisseria Gonorrhea: NEGATIVE
Trichomonas: NEGATIVE

## 2024-06-14 MED ORDER — FLUCONAZOLE 150 MG PO TABS: ORAL_TABLET | ORAL | 0 refills | Status: AC

## 2025-01-30 ENCOUNTER — Encounter: Admitting: Nurse Practitioner
